# Patient Record
Sex: Female | Born: 1940 | Race: White | Hispanic: No | Marital: Married | Smoking: Never smoker
Health system: Southern US, Community
[De-identification: ages and names within clinical notes are randomized; demographics above are authoritative.]

## PROBLEM LIST (undated history)

## (undated) DIAGNOSIS — J189 Pneumonia, unspecified organism: Secondary | ICD-10-CM

## (undated) DIAGNOSIS — I447 Left bundle-branch block, unspecified: Secondary | ICD-10-CM

## (undated) DIAGNOSIS — D689 Coagulation defect, unspecified: Secondary | ICD-10-CM

## (undated) DIAGNOSIS — I10 Essential (primary) hypertension: Secondary | ICD-10-CM

## (undated) HISTORY — DX: Coagulation defect, unspecified: D68.9

## (undated) HISTORY — PX: ABDOMINAL HYSTERECTOMY: SHX81

## (undated) HISTORY — PX: EYE SURGERY: SHX253

## (undated) HISTORY — DX: Pneumonia, unspecified organism: J18.9

## (undated) HISTORY — PX: MEDIAL PARTIAL KNEE REPLACEMENT: SHX5965

## (undated) HISTORY — DX: Essential (primary) hypertension: I10

## (undated) HISTORY — PX: OTHER SURGICAL HISTORY: SHX169

---

## 2004-02-21 ENCOUNTER — Ambulatory Visit: Payer: Self-pay | Admitting: Family Medicine

## 2005-02-27 ENCOUNTER — Ambulatory Visit: Payer: Self-pay | Admitting: Family Medicine

## 2005-03-13 ENCOUNTER — Ambulatory Visit: Payer: Self-pay | Admitting: Family Medicine

## 2005-04-11 ENCOUNTER — Ambulatory Visit: Payer: Self-pay | Admitting: Gastroenterology

## 2006-02-28 ENCOUNTER — Ambulatory Visit: Payer: Self-pay | Admitting: Family Medicine

## 2006-04-29 ENCOUNTER — Ambulatory Visit: Payer: Self-pay | Admitting: Gastroenterology

## 2007-03-26 ENCOUNTER — Ambulatory Visit: Payer: Self-pay | Admitting: Family Medicine

## 2008-09-16 ENCOUNTER — Ambulatory Visit: Payer: Self-pay | Admitting: Family Medicine

## 2008-12-22 ENCOUNTER — Ambulatory Visit: Payer: Self-pay | Admitting: Family Medicine

## 2009-05-24 ENCOUNTER — Ambulatory Visit: Payer: Self-pay | Admitting: Gastroenterology

## 2009-05-27 ENCOUNTER — Ambulatory Visit: Payer: Self-pay | Admitting: Family Medicine

## 2009-11-28 ENCOUNTER — Ambulatory Visit: Payer: Self-pay | Admitting: Family Medicine

## 2010-01-03 ENCOUNTER — Ambulatory Visit: Payer: Self-pay | Admitting: Family Medicine

## 2010-01-16 ENCOUNTER — Ambulatory Visit: Payer: Self-pay | Admitting: Family Medicine

## 2010-06-05 ENCOUNTER — Ambulatory Visit: Payer: Self-pay | Admitting: Family Medicine

## 2010-06-18 ENCOUNTER — Emergency Department: Payer: Self-pay | Admitting: Emergency Medicine

## 2011-01-22 ENCOUNTER — Ambulatory Visit: Payer: Self-pay | Admitting: Family Medicine

## 2011-08-09 ENCOUNTER — Ambulatory Visit: Payer: Self-pay | Admitting: Ophthalmology

## 2011-08-09 LAB — POTASSIUM: Potassium: 4.1 mmol/L (ref 3.5–5.1)

## 2011-08-29 ENCOUNTER — Ambulatory Visit: Payer: Self-pay | Admitting: Ophthalmology

## 2011-12-24 DIAGNOSIS — R739 Hyperglycemia, unspecified: Secondary | ICD-10-CM | POA: Insufficient documentation

## 2012-01-23 ENCOUNTER — Ambulatory Visit: Payer: Self-pay | Admitting: Family Medicine

## 2012-06-18 ENCOUNTER — Emergency Department: Payer: Self-pay

## 2012-06-18 LAB — COMPREHENSIVE METABOLIC PANEL
Albumin: 4.1 g/dL (ref 3.4–5.0)
Anion Gap: 4 — ABNORMAL LOW (ref 7–16)
Bilirubin,Total: 0.5 mg/dL (ref 0.2–1.0)
Calcium, Total: 9.2 mg/dL (ref 8.5–10.1)
Chloride: 105 mmol/L (ref 98–107)
Co2: 30 mmol/L (ref 21–32)
Creatinine: 0.76 mg/dL (ref 0.60–1.30)
EGFR (African American): >60
EGFR (Non-African Amer.): >60
Glucose: 106 mg/dL — ABNORMAL HIGH (ref 65–99)
Osmolality: 281 (ref 275–301)
Potassium: 3.8 mmol/L (ref 3.5–5.1)
SGOT(AST): 25 U/L (ref 15–37)
Sodium: 139 mmol/L (ref 136–145)
Total Protein: 7.5 g/dL (ref 6.4–8.2)

## 2012-06-18 LAB — CBC
HCT: 39.7 % (ref 35.0–47.0)
HGB: 13.5 g/dL (ref 12.0–16.0)
MCH: 30 pg (ref 26.0–34.0)
MCHC: 34.1 g/dL (ref 32.0–36.0)
Platelet: 151 10*3/uL (ref 150–440)
RBC: 4.51 10*6/uL (ref 3.80–5.20)
RDW: 14.2 % (ref 11.5–14.5)
WBC: 6.5 10*3/uL (ref 3.6–11.0)

## 2012-06-18 LAB — TROPONIN I: Troponin-I: <0.02 ng/mL

## 2012-06-18 LAB — PROTIME-INR: Prothrombin Time: 11.9 secs (ref 11.5–14.7)

## 2012-07-24 ENCOUNTER — Ambulatory Visit: Payer: Self-pay | Admitting: Unknown Physician Specialty

## 2012-11-12 ENCOUNTER — Ambulatory Visit: Payer: Self-pay | Admitting: Unknown Physician Specialty

## 2013-01-23 ENCOUNTER — Ambulatory Visit: Payer: Self-pay | Admitting: Family Medicine

## 2013-04-08 ENCOUNTER — Ambulatory Visit: Payer: Self-pay | Admitting: Ophthalmology

## 2013-07-15 DIAGNOSIS — IMO0001 Reserved for inherently not codable concepts without codable children: Secondary | ICD-10-CM | POA: Insufficient documentation

## 2013-07-15 DIAGNOSIS — Z7689 Persons encountering health services in other specified circumstances: Secondary | ICD-10-CM | POA: Insufficient documentation

## 2013-10-13 DIAGNOSIS — M179 Osteoarthritis of knee, unspecified: Secondary | ICD-10-CM | POA: Insufficient documentation

## 2013-10-13 DIAGNOSIS — Z9889 Other specified postprocedural states: Secondary | ICD-10-CM | POA: Insufficient documentation

## 2013-10-13 DIAGNOSIS — M1710 Unilateral primary osteoarthritis, unspecified knee: Secondary | ICD-10-CM | POA: Insufficient documentation

## 2013-10-27 ENCOUNTER — Ambulatory Visit: Payer: Self-pay | Admitting: Family Medicine

## 2014-01-26 ENCOUNTER — Ambulatory Visit: Payer: Self-pay | Admitting: Family Medicine

## 2014-03-31 DIAGNOSIS — Z9889 Other specified postprocedural states: Secondary | ICD-10-CM | POA: Insufficient documentation

## 2014-03-31 DIAGNOSIS — Z86718 Personal history of other venous thrombosis and embolism: Secondary | ICD-10-CM | POA: Insufficient documentation

## 2014-03-31 DIAGNOSIS — Z8679 Personal history of other diseases of the circulatory system: Secondary | ICD-10-CM | POA: Insufficient documentation

## 2014-03-31 DIAGNOSIS — I493 Ventricular premature depolarization: Secondary | ICD-10-CM | POA: Insufficient documentation

## 2014-06-27 NOTE — Op Note (Signed)
PATIENT NAME:  Theresa Wise, BLANN MR#:  [id number] DATE OF BIRTH:  05-Jun-1940  DATE OF PROCEDURE:  08/29/2011  PROCEDURES PERFORMED:  1. Pars plana vitrectomy of the left eye.  2. Internal limiting membrane peel of the left eye.   PREOPERATIVE DIAGNOSES:  1. Epiretinal membrane of the left eye.  2. Retinal edema of the left eye.   POSTOPERATIVE DIAGNOSES:  1. Epiretinal membrane of the left eye.  2. Retinal edema of the left eye.   ESTIMATED BLOOD LOSS: Less than 1 mL.   PRIMARY SURGEON: Teresa Pelton. Flynn Lininger, MD  ANESTHESIA: Retrobulbar block of the left eye with monitored anesthesia care.   COMPLICATIONS: None.   INDICATIONS FOR PROCEDURE: This is a patient that presented to my office with slowly decreasing vision in the left eye. Examination revealed an epiretinal membrane with associated retinal edema. Risks, benefits, and alternatives of the above procedure were discussed and the patient wished to proceed.   DETAILS OF PROCEDURE: After informed consent was obtained, the patient was brought into the operative suite at [facility]. Patient was placed in supine position, was given a small dose of Alfenta and a retrobulbar block was performed on the left eye by the primary surgeon without any complications. Left eye was prepped and draped in sterile manner. After lid speculum was inserted, a 25-gauge trocar was placed 4 mm beyond the limbus in the inferotemporal quadrant through displaced conjunctiva in an oblique fashion. The infusion cannula was turned on and inserted through the trocar and secured in position with Steri-Strips. Two more trocars were placed in a similar fashion superotemporally and superonasally. The vitreous cutter and light pipe were introduced in the eye and a core vitrectomy was performed. The vitreous face was confirmed to be elevated and the peripheral vitreous was trimmed for 360 degrees. Indocyanine green was injected onto the posterior pole and  then removed within 30 seconds. An epiretinal membrane peel followed by an internal limiting membrane peel was performed for 360 degrees around the fovea for a total diameter of two disk diameters. Scleral depressed exam was performed for 360 degrees. No signs of any open tears or retinal detachment could be identified for 360 degrees. A 75% air-fluid exchange was performed and the trocars were removed. The wounds were noted to be airtight and pressure in the eye was confirmed to be approximately 15 mmHg. 5 mg of dexamethasone was given into the inferior fornix and the lid speculum was removed. The eye was cleaned and TobraDex was placed in the eye. A patch and shield were placed over the eye and the patient was taken to postanesthesia care with instructions to remain head up.    ____________________________ Teresa Pelton. Starling Manns, MD mfa:cms D: 08/29/2011 10:13:57 ET T: 08/29/2011 12:35:58 ET JOB#: [id number]  cc: Teresa Pelton. Starling Manns, MD, <Dictator>  Coralee Rud MD ELECTRONICALLY SIGNED 09/17/2011 17:41

## 2014-07-20 DIAGNOSIS — C760 Malignant neoplasm of head, face and neck: Secondary | ICD-10-CM | POA: Insufficient documentation

## 2014-07-20 DIAGNOSIS — C44300 Unspecified malignant neoplasm of skin of unspecified part of face: Secondary | ICD-10-CM | POA: Insufficient documentation

## 2014-07-21 ENCOUNTER — Other Ambulatory Visit: Payer: Self-pay | Admitting: Family Medicine

## 2014-07-21 DIAGNOSIS — M810 Age-related osteoporosis without current pathological fracture: Secondary | ICD-10-CM

## 2014-11-20 IMAGING — CT CT OF THE RIGHT KNEE WITHOUT CONTRAST
1 of 3 series · 10 of 14 positions shown, 13 images · non-contrast
Comparison: none

REASON FOR EXAM: BRANCH KNEE    osteoarthrosis
COMMENTS:

PROCEDURE:     KCT - KCT KNEE RIGHT WITHOUT CONTRAST  - November 12, 2012 [DATE]
RESULT:     History: Osteoarthritis. Preoperative knee evaluation.

[Series 3: knee 0.75 b30f · axial · 0.42mm/px · z∈[+452,+656]mm · 10 of 357 slices shown, 13 images]
[im 33/357  soft-tissue]
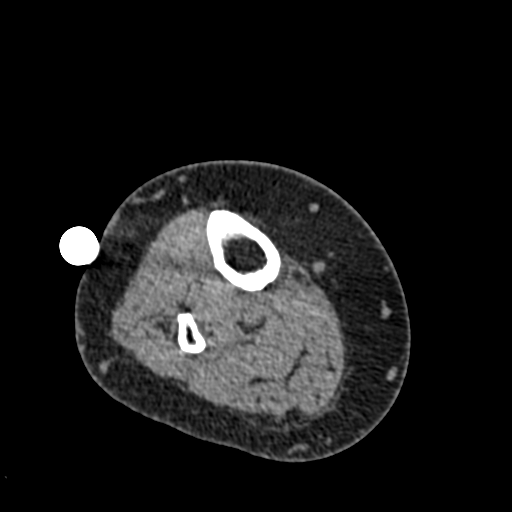
[im 33/357  bone]
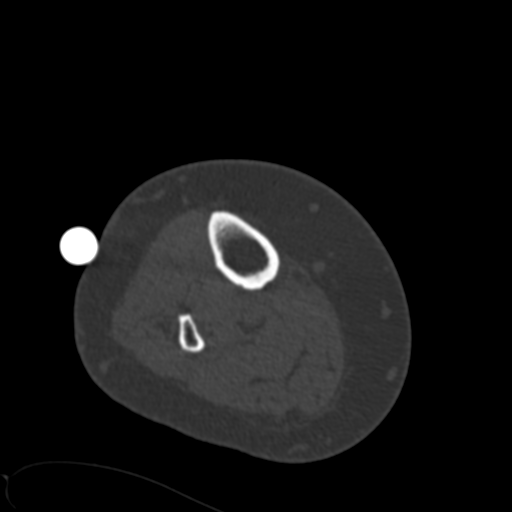
[im 65/357  bone]
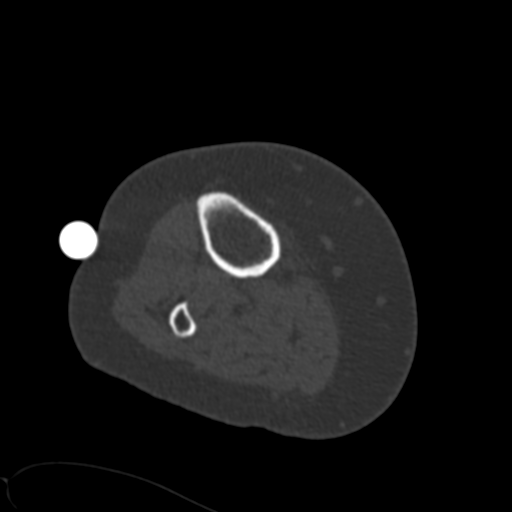
[im 98/357  bone]
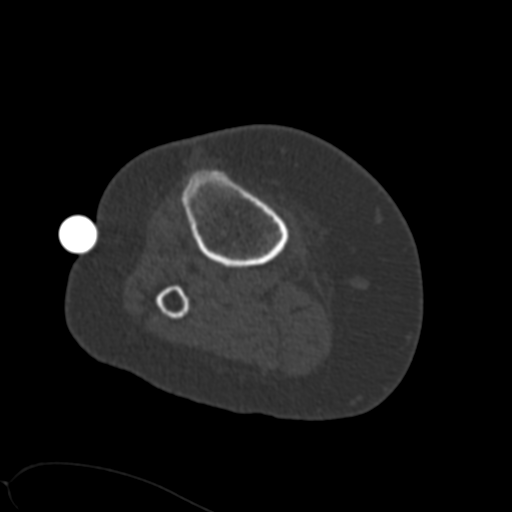
[im 130/357  bone]
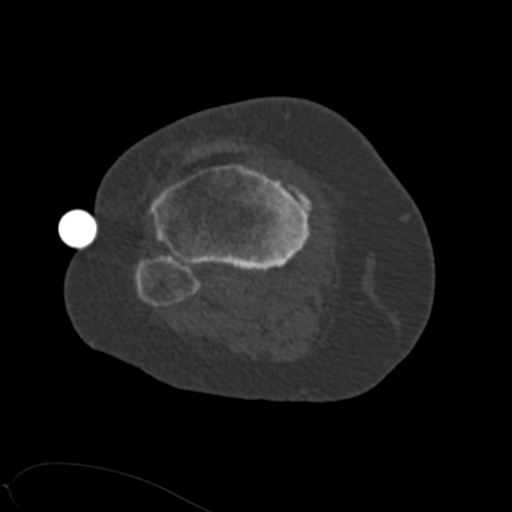
[im 162/357  soft-tissue]
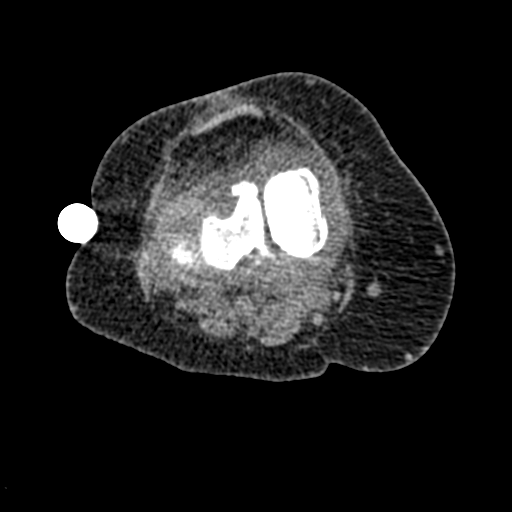
[im 162/357  bone]
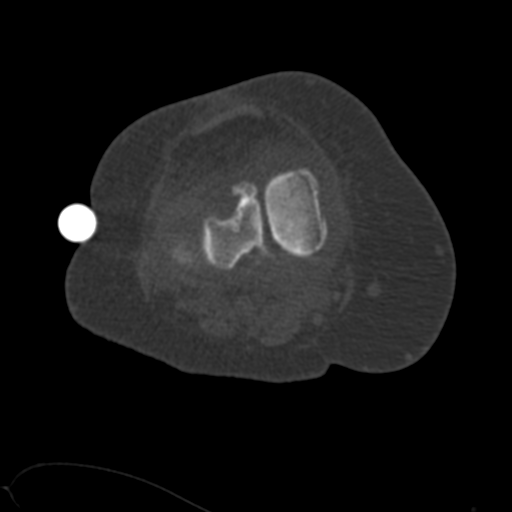
[im 195/357  bone]
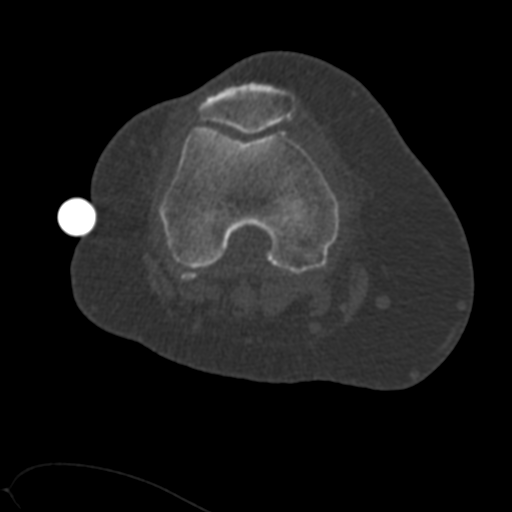
[im 227/357  bone]
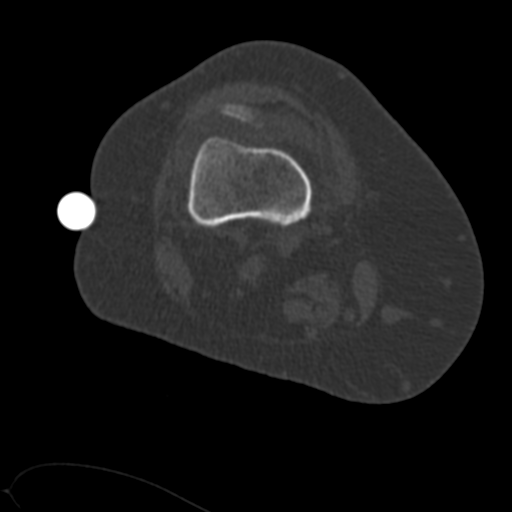
[im 259/357  bone]
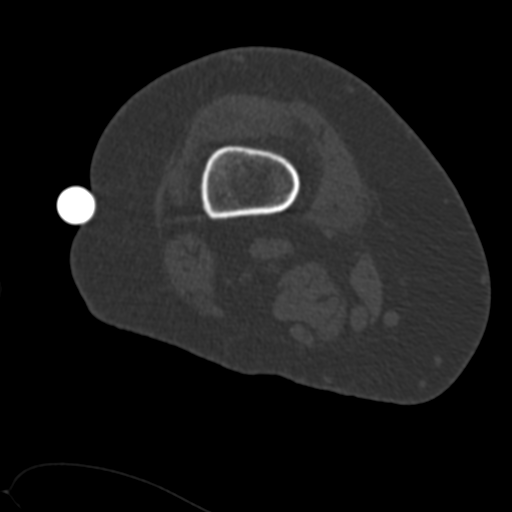
[im 292/357  soft-tissue]
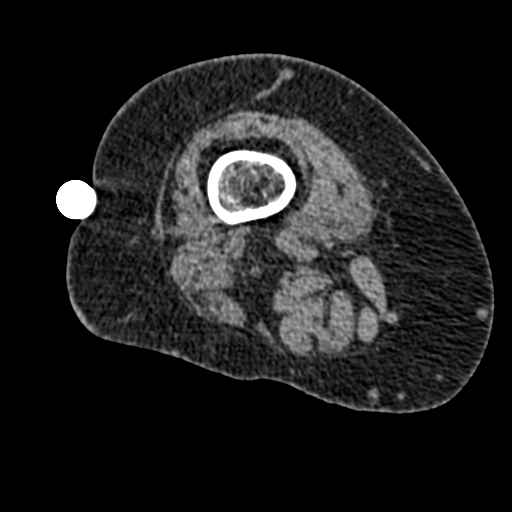
[im 292/357  bone]
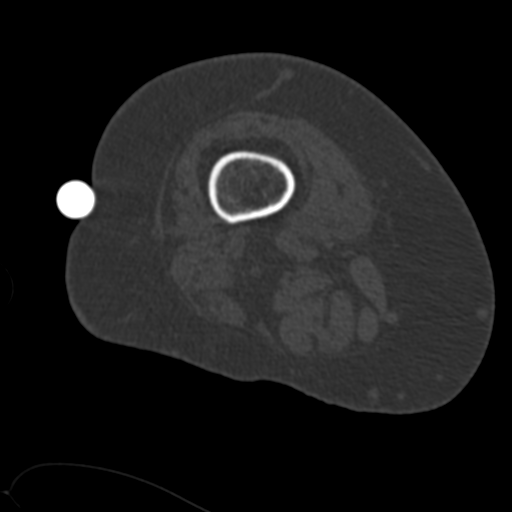
[im 324/357  bone]
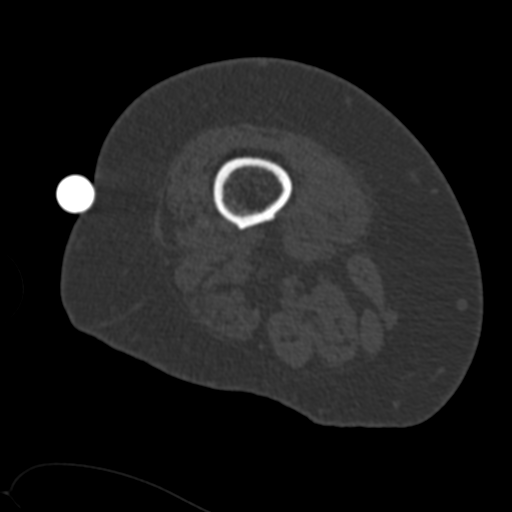

[10 of 14 positions shown; findings below may reference images not displayed]

FINDINGS: Standard CT obtained. Exam  obtained for preoperative evaluation .
There are severe degenerative changes noted about the right knee. Mild
changes about the right hip and ankle. No focal bony lesions identified.
IMPRESSION: Preoperative CT reveals severe degenerative changes right
knee.

## 2015-01-10 ENCOUNTER — Other Ambulatory Visit: Payer: Self-pay | Admitting: Family Medicine

## 2015-01-10 DIAGNOSIS — Z1231 Encounter for screening mammogram for malignant neoplasm of breast: Secondary | ICD-10-CM

## 2015-01-26 DIAGNOSIS — Z85828 Personal history of other malignant neoplasm of skin: Secondary | ICD-10-CM | POA: Insufficient documentation

## 2015-01-31 ENCOUNTER — Other Ambulatory Visit: Payer: Self-pay | Admitting: Family Medicine

## 2015-01-31 ENCOUNTER — Ambulatory Visit
Admission: RE | Admit: 2015-01-31 | Discharge: 2015-01-31 | Disposition: A | Discharge status: Home / Self Care | Payer: Medicare Other | Source: Ambulatory Visit | Attending: Family Medicine | Admitting: Family Medicine

## 2015-01-31 DIAGNOSIS — Z1231 Encounter for screening mammogram for malignant neoplasm of breast: Secondary | ICD-10-CM

## 2015-01-31 DIAGNOSIS — M810 Age-related osteoporosis without current pathological fracture: Secondary | ICD-10-CM | POA: Diagnosis not present

## 2015-01-31 DIAGNOSIS — Z13820 Encounter for screening for osteoporosis: Secondary | ICD-10-CM | POA: Insufficient documentation

## 2015-08-02 DIAGNOSIS — R29898 Other symptoms and signs involving the musculoskeletal system: Secondary | ICD-10-CM | POA: Insufficient documentation

## 2015-08-02 DIAGNOSIS — R269 Unspecified abnormalities of gait and mobility: Secondary | ICD-10-CM | POA: Insufficient documentation

## 2015-08-23 ENCOUNTER — Inpatient Hospital Stay: Payer: Medicare Other | Attending: Oncology | Admitting: Oncology

## 2015-08-23 ENCOUNTER — Inpatient Hospital Stay: Payer: Medicare Other

## 2015-08-23 ENCOUNTER — Encounter: Payer: Self-pay | Admitting: Oncology

## 2015-08-23 VITALS — BP 178/84 | HR 54 | Temp 98.0°F | Wt 162.5 lb

## 2015-08-23 DIAGNOSIS — Z79899 Other long term (current) drug therapy: Secondary | ICD-10-CM | POA: Diagnosis not present

## 2015-08-23 DIAGNOSIS — E785 Hyperlipidemia, unspecified: Secondary | ICD-10-CM | POA: Insufficient documentation

## 2015-08-23 DIAGNOSIS — I10 Essential (primary) hypertension: Secondary | ICD-10-CM | POA: Insufficient documentation

## 2015-08-23 DIAGNOSIS — D696 Thrombocytopenia, unspecified: Secondary | ICD-10-CM | POA: Diagnosis not present

## 2015-08-23 DIAGNOSIS — M810 Age-related osteoporosis without current pathological fracture: Secondary | ICD-10-CM | POA: Insufficient documentation

## 2015-08-23 DIAGNOSIS — M1990 Unspecified osteoarthritis, unspecified site: Secondary | ICD-10-CM | POA: Insufficient documentation

## 2015-08-23 DIAGNOSIS — R609 Edema, unspecified: Secondary | ICD-10-CM | POA: Diagnosis not present

## 2015-08-23 LAB — FERRITIN: FERRITIN: 50 ng/mL (ref 11–307)

## 2015-08-23 LAB — CBC
HCT: 38.1 % (ref 35.0–47.0)
HEMOGLOBIN: 13.2 g/dL (ref 12.0–16.0)
MCH: 30.3 pg (ref 26.0–34.0)
MCHC: 34.5 g/dL (ref 32.0–36.0)
MCV: 87.9 fL (ref 80.0–100.0)
Platelets: 143 10*3/uL — ABNORMAL LOW (ref 150–440)
RBC: 4.34 MIL/uL (ref 3.80–5.20)
RDW: 13.5 % (ref 11.5–14.5)
WBC: 4.2 10*3/uL (ref 3.6–11.0)

## 2015-08-23 LAB — VITAMIN B12: Vitamin B-12: 211 pg/mL (ref 180–914)

## 2015-08-23 LAB — IRON AND TIBC
IRON: 69 ug/dL (ref 28–170)
SATURATION RATIOS: 18 % (ref 10.4–31.8)
TIBC: 376 ug/dL (ref 250–450)
UIBC: 307 ug/dL

## 2015-08-23 LAB — FOLATE: Folate: 26 ng/mL (ref >5.9)

## 2015-08-23 NOTE — Progress Notes (Signed)
Patient has history of high blood pressure and lower extremity edema.  Patient advised to inform primary care physician.

## 2015-08-24 LAB — PROTEIN ELECTROPHORESIS, SERUM
A/G Ratio: 1.4 (ref 0.7–1.7)
ALPHA-1-GLOBULIN: 0.3 g/dL (ref 0.0–0.4)
ALPHA-2-GLOBULIN: 0.6 g/dL (ref 0.4–1.0)
Albumin ELP: 4 g/dL (ref 2.9–4.4)
Beta Globulin: 1 g/dL (ref 0.7–1.3)
GLOBULIN, TOTAL: 2.8 g/dL (ref 2.2–3.9)
Gamma Globulin: 0.9 g/dL (ref 0.4–1.8)
TOTAL PROTEIN ELP: 6.8 g/dL (ref 6.0–8.5)

## 2015-08-24 LAB — PLATELET ANTIBODY PROFILE, SERUM
HLA Ab Ser Ql EIA: NEGATIVE
IA/IIA ANTIBODY: NEGATIVE
IB/IX ANTIBODY: NEGATIVE
IIB/IIIA Antibody: NEGATIVE

## 2015-08-24 NOTE — Progress Notes (Signed)
[facility]  [phone] [phone]  ID: Arlenne J Bueso OB: 01/16/1941  MR#: [id number]  CSN#:[id number]  No care team member to display  CHIEF COMPLAINT:  Chief Complaint  Patient presents with  . New Patient (Initial Visit)    INTERVAL HISTORY: Patient is 75-year-old female who was noted to have a declining platelet count on routine blood work. Her only complaint today is of worsening lower extremity edema. She otherwise feels well and is asymptomatic. She denies any easy bleeding or bruising. She has no neurologic complaints. She denies any recent fevers or illnesses. She has a good appetite and denies weight loss. She has no chest pain or shortness of breath. She denies any nausea, vomiting, constipation, or diarrhea. She has no urinary complaints. Patient feels at her baseline and offers no further specific complaints today.  REVIEW OF SYSTEMS:   Review of Systems  Constitutional: Negative.  Negative for fever, weight loss and malaise/fatigue.  Respiratory: Negative.  Negative for cough and shortness of breath.   Cardiovascular: Positive for leg swelling. Negative for chest pain.  Gastrointestinal: Negative.  Negative for abdominal pain.  Genitourinary: Negative.   Musculoskeletal: Negative.   Neurological: Negative.  Negative for weakness.  Psychiatric/Behavioral: Negative.     As per HPI. Otherwise, a complete review of systems is negatve.  PAST MEDICAL HISTORY: Past Medical History  Diagnosis Date  . Clotting disorder (HCC)   . Hypertension     PAST SURGICAL HISTORY: History reviewed. No pertinent past surgical history.  FAMILY HISTORY Family History  Problem Relation Age of Onset  . Diabetes Father        ADVANCED DIRECTIVES:    HEALTH MAINTENANCE: Social History  Substance Use Topics  . Smoking status: Never Smoker   . Smokeless tobacco: None  . Alcohol Use: No     Colonoscopy:  PAP:  Bone density:  Lipid  panel:  Allergies  Allergen Reactions  . Codeine Hives    Current Outpatient Prescriptions  Medication Sig Dispense Refill  . amLODipine (NORVASC) 5 MG tablet TAKE 1 TABLET (5 MG TOTAL) BY MOUTH ONCE DAILY.  6  . Ascorbic Acid (VITAMIN C) 1000 MG tablet Take by mouth.    . Cholecalciferol (VITAMIN D3) 2000 units capsule Take by mouth.    . hydrochlorothiazide (HYDRODIURIL) 25 MG tablet     . metoprolol (LOPRESSOR) 100 MG tablet     . ramipril (ALTACE) 10 MG capsule Take by mouth.    . simvastatin (ZOCOR) 10 MG tablet      No current facility-administered medications for this visit.    OBJECTIVE: Filed Vitals:   08/23/15 1102  BP: 178/84  Pulse: 54  Temp: 98 F (36.7 C)     There is no height on file to calculate BMI.    ECOG FS:0 - Asymptomatic  General: Well-developed, well-nourished, no acute distress. Eyes: Pink conjunctiva, anicteric sclera. HEENT: Normocephalic, moist mucous membranes, clear oropharnyx. Lungs: Clear to auscultation bilaterally. Heart: Regular rate and rhythm. No rubs, murmurs, or gallops. Abdomen: Soft, nontender, nondistended. No organomegaly noted, normoactive bowel sounds. Musculoskeletal: Mild bilateral lower extremity edema. Neuro: Alert, answering all questions appropriately. Cranial nerves grossly intact. Skin: No rashes or petechiae noted. Psych: Normal affect. Lymphatics: No cervical, calvicular, axillary or inguinal LAD.   LAB RESULTS:  Lab Results  Component Value Date   NA 139 06/18/2012   K 3.8 06/18/2012   CL 105 06/18/2012   CO2 30 06/18/2012   GLUCOSE 106* 06/18/2012     BUN 22* 06/18/2012   CREATININE 0.76 06/18/2012   CALCIUM 9.2 06/18/2012   PROT 7.5 06/18/2012   ALBUMIN 4.1 06/18/2012   AST 25 06/18/2012   ALT 23 06/18/2012   ALKPHOS 67 06/18/2012   BILITOT 0.5 06/18/2012   GFRNONAA >60 06/18/2012   GFRAA >60 06/18/2012    Lab Results  Component Value Date   WBC 4.2 08/23/2015   HGB 13.2 08/23/2015   HCT 38.1  08/23/2015   MCV 87.9 08/23/2015   PLT 143* 08/23/2015   Lab Results  Component Value Date   IRON 69 08/23/2015   TIBC 376 08/23/2015   IRONPCTSAT 18 08/23/2015    Lab Results  Component Value Date   FERRITIN 50 08/23/2015     STUDIES: No results found.  ASSESSMENT: Thrombocytopenia.  PLAN:    1. Thrombocytopenia: Patient has slowly declining platelet count over 1 year with her platelets decreasing from 185-128. White count today has increased to 143 and is nearly back to normal limits. All of her lab work from today is either negative or within normal limits. Platelet antibodies and protein electrophoresis are pending at time of dictation. Unclear etiology of thrombocytopenia at this point. Patient does not require bone marrow biopsy. No further intervention is needed. Return to clinic in 3 months with repeat laboratory work and further evaluation. 2. Hypertension: Patient's blood pressure is significantly elevated today. Results were forwarded to primary care. 3. Peripheral edema: Continue hydrochlorothiazide and further treatment with primary care physician.  Patient expressed understanding and was in agreement with this plan. She also understands that She can call clinic at any time with any questions, concerns, or complaints.   Timothy J Finnegan, MD   08/24/2015 10:10 AM      

## 2015-10-31 ENCOUNTER — Other Ambulatory Visit: Payer: Self-pay | Admitting: Neurology

## 2015-10-31 DIAGNOSIS — R519 Headache, unspecified: Secondary | ICD-10-CM

## 2015-10-31 DIAGNOSIS — R51 Headache: Principal | ICD-10-CM

## 2015-11-12 ENCOUNTER — Ambulatory Visit
Admission: RE | Admit: 2015-11-12 | Discharge: 2015-11-12 | Disposition: A | Discharge status: Home / Self Care | Payer: Medicare Other | Source: Ambulatory Visit | Attending: Neurology | Admitting: Neurology

## 2015-11-12 DIAGNOSIS — Z96651 Presence of right artificial knee joint: Secondary | ICD-10-CM | POA: Insufficient documentation

## 2015-11-12 DIAGNOSIS — M47892 Other spondylosis, cervical region: Secondary | ICD-10-CM | POA: Diagnosis not present

## 2015-11-12 DIAGNOSIS — R51 Headache: Secondary | ICD-10-CM | POA: Insufficient documentation

## 2015-11-12 DIAGNOSIS — I10 Essential (primary) hypertension: Secondary | ICD-10-CM | POA: Insufficient documentation

## 2015-11-12 DIAGNOSIS — R9082 White matter disease, unspecified: Secondary | ICD-10-CM | POA: Insufficient documentation

## 2015-11-12 DIAGNOSIS — R519 Headache, unspecified: Secondary | ICD-10-CM

## 2015-11-12 LAB — POCT I-STAT CREATININE: Creatinine, Ser: 0.9 mg/dL (ref 0.44–1.00)

## 2015-11-12 MED ORDER — GADOBENATE DIMEGLUMINE 529 MG/ML IV SOLN
15.0000 mL | Freq: Once | INTRAVENOUS | Status: AC | PRN
  Administered 2015-11-12: 15 mL via INTRAVENOUS

## 2015-11-21 DIAGNOSIS — D696 Thrombocytopenia, unspecified: Secondary | ICD-10-CM | POA: Insufficient documentation

## 2015-11-21 NOTE — Progress Notes (Signed)
[place]  [phone] [phone]  ID: Theresa Wise OB: March 27, 1940  MR#: [id number]  QQI#:[id number]  Patient Care Team: Gayland Curry, MD as PCP - General (Family Medicine)  CHIEF COMPLAINT: Thrombocytopenia  INTERVAL HISTORY: [name] for repeat laboratory work and further evaluation. She is having some lower extremity neurologic complaints and is currently being worked up by Dr. Brigitte Pulse. She otherwise feels well and is asymptomatic. She denies any easy bleeding or bruising. She has no other neurologic complaints. She denies any recent fevers or illnesses. She has a good appetite and denies weight loss. She has no chest pain or shortness of breath. She denies any nausea, vomiting, constipation, or diarrhea. She has no urinary complaints. Patient feels at her baseline and offers no further specific complaints today.  REVIEW OF SYSTEMS:   Review of Systems  Constitutional: Negative.  Negative for fever, malaise/fatigue and weight loss.  Respiratory: Negative.  Negative for cough and shortness of breath.   Cardiovascular: Positive for leg swelling. Negative for chest pain.  Gastrointestinal: Negative.  Negative for abdominal pain.  Genitourinary: Negative.   Musculoskeletal: Negative.   Neurological: Negative.  Negative for weakness.  Psychiatric/Behavioral: Negative.     As per HPI. Otherwise, a complete review of systems is negative.  PAST MEDICAL HISTORY: Past Medical History:  Diagnosis Date  . Clotting disorder ([place])   . Hypertension     PAST SURGICAL HISTORY: No past surgical history on file.  FAMILY HISTORY Family History  Problem Relation Age of Onset  . Diabetes Father        ADVANCED DIRECTIVES:    HEALTH MAINTENANCE: Social History  Substance Use Topics  . Smoking status: Never Smoker  . Smokeless tobacco: Not on file  . Alcohol use No     Colonoscopy:  PAP:  Bone density:  Lipid  panel:  Allergies  Allergen Reactions  . Codeine Hives    Current Outpatient Prescriptions  Medication Sig Dispense Refill  . amLODipine (NORVASC) 5 MG tablet TAKE 1 TABLET (5 MG TOTAL) BY MOUTH ONCE DAILY.  6  . Ascorbic Acid (VITAMIN C) 1000 MG tablet Take 1,000 mg by mouth daily.     . Cholecalciferol (VITAMIN D3) 2000 units capsule Take 2,000 Units by mouth daily.     . metoprolol (LOPRESSOR) 100 MG tablet Take 100 mg by mouth 2 (two) times daily.     . ramipril (ALTACE) 10 MG capsule Take 20 mg by mouth 2 (two) times daily.     . simvastatin (ZOCOR) 10 MG tablet Take 10 mg by mouth daily at 6 PM.      No current facility-administered medications for this visit.     OBJECTIVE: Vitals:   11/22/15 1050  BP: (!) 194/104  Pulse: (!) 57  Resp: 18  Temp: 97.3 F (36.3 C)     There is no height or weight on file to calculate BMI.    ECOG FS:0 - Asymptomatic  General: Well-developed, well-nourished, no acute distress. Eyes: Pink conjunctiva, anicteric sclera. Lungs: Clear to auscultation bilaterally. Heart: Regular rate and rhythm. No rubs, murmurs, or gallops. Abdomen: Soft, nontender, nondistended. No organomegaly noted, normoactive bowel sounds. Musculoskeletal: Mild bilateral lower extremity edema. Neuro: Alert, answering all questions appropriately. Cranial nerves grossly intact. Skin: No rashes or petechiae noted. Psych: Normal affect.  LAB RESULTS:  Lab Results  Component Value Date   NA 139 06/18/2012   K 3.8 06/18/2012   CL 105 06/18/2012  CO2 30 06/18/2012   GLUCOSE 106 (H) 06/18/2012   BUN 22 (H) 06/18/2012   CREATININE 0.90 11/12/2015   CALCIUM 9.2 06/18/2012   PROT 7.5 06/18/2012   ALBUMIN 4.1 06/18/2012   AST 25 06/18/2012   ALT 23 06/18/2012   ALKPHOS 67 06/18/2012   BILITOT 0.5 06/18/2012   GFRNONAA >60 06/18/2012   GFRAA >60 06/18/2012    Lab Results  Component Value Date   WBC 4.2 08/23/2015   HGB 13.2 08/23/2015   HCT 38.1 08/23/2015    MCV 87.9 08/23/2015   PLT 143 (L) 08/23/2015   Lab Results  Component Value Date   IRON 69 08/23/2015   TIBC 376 08/23/2015   IRONPCTSAT 18 08/23/2015    Lab Results  Component Value Date   FERRITIN 50 08/23/2015     STUDIES: Mr Jeri Cos HB Contrast  Result Date: 11/12/2015 CLINICAL DATA:  Balance issues. Headaches for 1 year. Dizziness. Trauma to head 1 year ago. EXAM: MRI HEAD WITHOUT AND WITH CONTRAST TECHNIQUE: Multiplanar, multiecho pulse sequences of the brain and surrounding structures were obtained without and with intravenous contrast. CONTRAST:  76m MULTIHANCE GADOBENATE DIMEGLUMINE 529 MG/ML IV SOLN COMPARISON:  CT face 06/18/2010. Report of brain MRI 12/26/1998. The images are no longer available. FINDINGS: Mild atrophy and moderate white matter changes are advanced for age. No acute infarct, hemorrhage, or mass lesion is present. The ventricles are proportionate to the degree of atrophy. Remote lacunar infarcts and predominantly dilated perivascular spaces are present within the basal ganglia bilaterally. Mild white matter changes extend into the brainstem. The cerebellum is unremarkable. The internal auditory canals are within normal limits bilaterally. Flow is present in the major intracranial arteries. A left lens replacement is noted. Globes and orbits are otherwise intact. Moderate diffuse mucosal thickening is present in the left maxillary sinus. There is more minimal disease on the right. Mild mucosal thickening is present in the anterior ethmoid air cells, left greater than right. There is some fluid in the inferior mastoid air cells on the left. No obstructing nasopharyngeal lesion is present. Skullbase is within normal limits. Intracranial midline sagittal structures are unremarkable. A relatively empty sella is noted. Degenerative change are present in the upper cervical spine with slight degenerative anterolisthesis at C3-4 and C4-5. Spinal canal is preserved. The  postcontrast images demonstrate no pathologic enhancement. IMPRESSION: 1. No acute intracranial abnormality. 2. 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 3. No pathologic enhancement. 4. Degenerative changes in the upper cervical spine. Electronically Signed   By: CSan MorelleM.D.   On: 11/12/2015 11:47    ASSESSMENT: Thrombocytopenia.  PLAN:    1. Thrombocytopenia: Patient's platelet count is 134 today, which is essentially unchanged. Previously, all of her laboratory work was either negative or within normal limits including platelet antibodies and protein electrophoresis. No intervention is needed at this time. Patient does not require bone marrow biopsy. After lengthy discussion with the patient, she agreed that no further follow-up is necessary.  Please monitor platelet count 2-3 times a year and if it trends below 100 and remains there, please refer her back for further evaluation.  2. Hypertension: Patient's blood pressure is significantly elevated today. Results were forwarded to primary care. 3. Lower extremities numbness tingling: Continued evaluation and treatment per neurology.   Patient expressed understanding and was in agreement with this plan. She also understands that She can call clinic at any time with any questions, concerns, or complaints.  Lloyd Huger, MD   11/22/2015 11:38 AM

## 2015-11-22 ENCOUNTER — Encounter: Payer: Self-pay | Admitting: Oncology

## 2015-11-22 ENCOUNTER — Inpatient Hospital Stay: Payer: Medicare Other

## 2015-11-22 ENCOUNTER — Inpatient Hospital Stay: Payer: Medicare Other | Attending: Oncology | Admitting: Oncology

## 2015-11-22 ENCOUNTER — Other Ambulatory Visit: Payer: Self-pay

## 2015-11-22 DIAGNOSIS — R200 Anesthesia of skin: Secondary | ICD-10-CM

## 2015-11-22 DIAGNOSIS — I10 Essential (primary) hypertension: Secondary | ICD-10-CM | POA: Insufficient documentation

## 2015-11-22 DIAGNOSIS — D696 Thrombocytopenia, unspecified: Secondary | ICD-10-CM | POA: Diagnosis not present

## 2015-11-22 DIAGNOSIS — D689 Coagulation defect, unspecified: Secondary | ICD-10-CM | POA: Insufficient documentation

## 2015-11-22 DIAGNOSIS — Z79899 Other long term (current) drug therapy: Secondary | ICD-10-CM | POA: Insufficient documentation

## 2015-11-22 DIAGNOSIS — M7989 Other specified soft tissue disorders: Secondary | ICD-10-CM | POA: Insufficient documentation

## 2015-11-22 LAB — CBC WITH DIFFERENTIAL/PLATELET
BASOS ABS: 0.1 10*3/uL (ref 0–0.1)
BASOS PCT: 1 %
EOS ABS: 0.2 10*3/uL (ref 0–0.7)
Eosinophils Relative: 4 %
HCT: 38.2 % (ref 35.0–47.0)
Hemoglobin: 13.1 g/dL (ref 12.0–16.0)
Lymphocytes Relative: 31 %
Lymphs Abs: 1.5 10*3/uL (ref 1.0–3.6)
MCH: 29.9 pg (ref 26.0–34.0)
MCHC: 34.3 g/dL (ref 32.0–36.0)
MCV: 87.3 fL (ref 80.0–100.0)
MONO ABS: 0.4 10*3/uL (ref 0.2–0.9)
MONOS PCT: 9 %
Neutro Abs: 2.7 10*3/uL (ref 1.4–6.5)
Neutrophils Relative %: 55 %
PLATELETS: 134 10*3/uL — AB (ref 150–440)
RBC: 4.38 MIL/uL (ref 3.80–5.20)
RDW: 13.3 % (ref 11.5–14.5)
WBC: 4.9 10*3/uL (ref 3.6–11.0)

## 2015-11-22 NOTE — Progress Notes (Signed)
States is having intermittent weakness in lower extremities that has been occurring for the past few months.

## 2016-01-09 ENCOUNTER — Other Ambulatory Visit: Payer: Self-pay | Admitting: Family Medicine

## 2016-01-09 DIAGNOSIS — Z1231 Encounter for screening mammogram for malignant neoplasm of breast: Secondary | ICD-10-CM

## 2016-02-21 ENCOUNTER — Ambulatory Visit
Admission: RE | Admit: 2016-02-21 | Discharge: 2016-02-21 | Disposition: A | Discharge status: Home / Self Care | Payer: Medicare Other | Source: Ambulatory Visit | Attending: Family Medicine | Admitting: Family Medicine

## 2016-02-21 DIAGNOSIS — Z1231 Encounter for screening mammogram for malignant neoplasm of breast: Secondary | ICD-10-CM | POA: Diagnosis not present

## 2016-05-30 DIAGNOSIS — E538 Deficiency of other specified B group vitamins: Secondary | ICD-10-CM | POA: Insufficient documentation

## 2016-05-30 DIAGNOSIS — G5712 Meralgia paresthetica, left lower limb: Secondary | ICD-10-CM | POA: Insufficient documentation

## 2016-05-30 DIAGNOSIS — R519 Headache, unspecified: Secondary | ICD-10-CM | POA: Insufficient documentation

## 2016-05-30 DIAGNOSIS — G608 Other hereditary and idiopathic neuropathies: Secondary | ICD-10-CM | POA: Insufficient documentation

## 2016-07-10 ENCOUNTER — Encounter: Payer: Self-pay | Admitting: *Deleted

## 2016-07-10 ENCOUNTER — Emergency Department: Payer: Medicare Other

## 2016-07-10 ENCOUNTER — Emergency Department
Admission: EM | Admit: 2016-07-10 | Discharge: 2016-07-10 | Disposition: A | Discharge status: Home / Self Care | Payer: Medicare Other | Attending: Emergency Medicine | Admitting: Emergency Medicine

## 2016-07-10 DIAGNOSIS — R05 Cough: Secondary | ICD-10-CM | POA: Insufficient documentation

## 2016-07-10 DIAGNOSIS — I10 Essential (primary) hypertension: Secondary | ICD-10-CM | POA: Insufficient documentation

## 2016-07-10 DIAGNOSIS — R42 Dizziness and giddiness: Secondary | ICD-10-CM

## 2016-07-10 DIAGNOSIS — R079 Chest pain, unspecified: Secondary | ICD-10-CM | POA: Diagnosis not present

## 2016-07-10 DIAGNOSIS — Z79899 Other long term (current) drug therapy: Secondary | ICD-10-CM | POA: Diagnosis not present

## 2016-07-10 DIAGNOSIS — R0602 Shortness of breath: Secondary | ICD-10-CM | POA: Insufficient documentation

## 2016-07-10 DIAGNOSIS — J449 Chronic obstructive pulmonary disease, unspecified: Secondary | ICD-10-CM | POA: Diagnosis not present

## 2016-07-10 DIAGNOSIS — R110 Nausea: Secondary | ICD-10-CM | POA: Insufficient documentation

## 2016-07-10 LAB — BASIC METABOLIC PANEL
Anion gap: 9 (ref 5–15)
BUN: 18 mg/dL (ref 6–20)
CALCIUM: 8.8 mg/dL — AB (ref 8.9–10.3)
CHLORIDE: 100 mmol/L — AB (ref 101–111)
CO2: 28 mmol/L (ref 22–32)
CREATININE: 0.93 mg/dL (ref 0.44–1.00)
GFR calc non Af Amer: 57 mL/min — ABNORMAL LOW (ref >60)
GLUCOSE: 154 mg/dL — AB (ref 65–99)
Potassium: 3.9 mmol/L (ref 3.5–5.1)
Sodium: 137 mmol/L (ref 135–145)

## 2016-07-10 LAB — CBC
HCT: 38.3 % (ref 35.0–47.0)
Hemoglobin: 13.1 g/dL (ref 12.0–16.0)
MCH: 29.9 pg (ref 26.0–34.0)
MCHC: 34.2 g/dL (ref 32.0–36.0)
MCV: 87.5 fL (ref 80.0–100.0)
PLATELETS: 142 10*3/uL — AB (ref 150–440)
RBC: 4.38 MIL/uL (ref 3.80–5.20)
RDW: 13.9 % (ref 11.5–14.5)
WBC: 12.7 10*3/uL — ABNORMAL HIGH (ref 3.6–11.0)

## 2016-07-10 LAB — TROPONIN I

## 2016-07-10 MED ORDER — MECLIZINE HCL 25 MG PO TABS
25.0000 mg | ORAL_TABLET | Freq: Once | ORAL | Status: AC
  Administered 2016-07-10: 25 mg via ORAL
  Filled 2016-07-10: qty 1

## 2016-07-10 MED ORDER — MECLIZINE HCL 25 MG PO TABS: 25.0000 mg | ORAL_TABLET | Freq: Three times a day (TID) | ORAL | 0 refills | Status: DC | PRN

## 2016-07-10 NOTE — ED Notes (Signed)
MRI  [facility]  CALLED  INFORMED  RN  AMY  COYNE

## 2016-07-10 NOTE — ED Notes (Addendum)
Pt reports feeling dizzy today while lying in bed.  Intermittent chest pain and sob. Nausea earlier today.  No vomiting.  Pt also has redness and swelling to right lower leg. Pt states her cat scratched her yesterday.    Pt alert   Speech clear.  Family with pt

## 2016-07-10 NOTE — ED Provider Notes (Signed)
[facility] Emergency Department Provider Note   ____________________________________________   I have reviewed the triage vital signs and the nursing notes.   HISTORY  Chief Complaint Dizziness  History limited by: Not Limited   HPI Theresa Wise is a 76 y.o. female who presents to the emergency department today with primary complaint for dizziness. The patient states that this started today. She feels lightheaded inside the room spinning around her. This has been constant since it started. It occurs when the patient is lying flat. Patient denies similar symptoms in the past. She has had some accompanying nausea. Patient patient has had some shortness of breath and chest pain today. No fevers. No recent illness.   Past Medical History:  Diagnosis Date  . Clotting disorder ([place])   . Hypertension     Patient Active Problem List   Diagnosis Date Noted  . Thrombocytopenia ([place]) 11/21/2015  . Arthritis 08/23/2015  . HLD (hyperlipidemia) 08/23/2015  . BP (high blood pressure) 08/23/2015  . 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 Abnormal gait 08/02/2015  . Leg weakness 08/02/2015  . H/O malignant neoplasm of skin 01/26/2015  . 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 History of cardiac catheterization 03/31/2014  . Personal history of other diseases of the circulatory system 03/31/2014  . H/O deep venous thrombosis 03/31/2014  . Beat, premature ventricular 03/31/2014  . Arthritis of knee, degenerative 10/13/2013  . History of knee surgery 10/13/2013  . Referred by specialist physician 07/15/2013  . Blood glucose elevated 12/24/2011    History reviewed. No pertinent surgical history.  Prior to Admission medications   Medication Sig Start Date End Date Taking? Authorizing Provider  amLODipine (NORVASC) 5 MG tablet TAKE 1 TABLET (5 MG TOTAL) BY MOUTH ONCE DAILY. 08/14/15   [provider]  Ascorbic Acid (VITAMIN C) 1000 MG tablet  Take 1,000 mg by mouth daily.     [provider]  Cholecalciferol (VITAMIN D3) 2000 units capsule Take 2,000 Units by mouth daily.     [provider]  metoprolol (LOPRESSOR) 100 MG tablet Take 100 mg by mouth 2 (two) times daily.  07/07/15   [provider]  ramipril (ALTACE) 10 MG capsule Take 20 mg by mouth 2 (two) times daily.  05/10/15   [provider]  simvastatin (ZOCOR) 10 MG tablet Take 10 mg by mouth daily at 6 PM.  07/05/15   [provider]    Allergies Codeine  Family History  Problem Relation Age of Onset  . Diabetes Father     Social History Social History  Substance Use Topics  . Smoking status: Never Smoker  . Smokeless tobacco: Never Used  . Alcohol use No    Review of Systems Constitutional: No fever/chills Eyes: No visual changes. ENT: No sore throat. Cardiovascular: Positive chest pain. Respiratory: Positive shortness of breath. Gastrointestinal: No abdominal pain.  No nausea, no vomiting.  No diarrhea.   Genitourinary: Negative for dysuria. Musculoskeletal: Negative for back pain. Skin: Negative for rash. Neurological: Positive for dizziness.  ____________________________________________   PHYSICAL EXAM:  VITAL SIGNS: ED Triage Vitals  Enc Vitals Group     BP 07/10/16 1546 120/69     Pulse Rate 07/10/16 1546 75     Resp 07/10/16 1546 18     Temp 07/10/16 1546 99.9 F (37.7 C)     Temp Source 07/10/16 1546 Oral     SpO2 07/10/16 1546 95 %     Weight 07/10/16 1546 160 lb (  72.6 kg)     Height 07/10/16 1546 4\' 11"  (1.499 m)     Head Circumference --      Peak Flow --      Pain Score 07/10/16 1545 5     Pain Loc --      Pain Edu? --      Excl. in [place]? --     Constitutional: Alert and oriented. Well appearing and in no distress. Eyes: Conjunctivae are normal. Normal extraocular movements. ENT   Head: Normocephalic and atraumatic.   Nose: No congestion/rhinnorhea.   Mouth/Throat: Mucous  membranes are moist.   Neck: No stridor. Hematological/Lymphatic/Immunilogical: No cervical lymphadenopathy. Cardiovascular: Normal rate, regular rhythm.  No murmurs, rubs, or gallops. Respiratory: Normal respiratory effort without tachypnea nor retractions. Breath sounds are clear and equal bilaterally. No wheezes/rales/rhonchi. Gastrointestinal: Soft and non tender. No rebound. No guarding.  Genitourinary: Deferred Musculoskeletal: Normal range of motion in all extremities. No lower extremity edema. Neurologic:  Normal speech and language. No gross focal neurologic deficits are appreciated.  Skin:  Skin is warm, dry and intact. No rash noted. Psychiatric: Mood and affect are normal. Speech and behavior are normal. Patient exhibits appropriate insight and judgment.  ____________________________________________    LABS (pertinent positives/negatives)  Labs Reviewed  BASIC METABOLIC PANEL - Abnormal; Notable for the following:       Result Value   Chloride 100 (*)    Glucose, Bld 154 (*)    Calcium 8.8 (*)    GFR calc non Af Amer 57 (*)    All other components within normal limits  CBC - Abnormal; Notable for the following:    WBC 12.7 (*)    Platelets 142 (*)    All other components within normal limits  TROPONIN I     ____________________________________________   EKG  I, Nance Pear, attending physician, personally viewed and interpreted this EKG  EKG Time: 1548 Rate: 73 Rhythm: normal sinus rhythm Axis: left axis deviation Intervals: qtc 464 QRS: LBBB ST changes: no st elevation Impression: abnormal ekg  ____________________________________________    RADIOLOGY  CXR  IMPRESSION: Borderline cardiomegaly. No focal infiltrate or edema.  MR brain IMPRESSION:  No cause of the acute presentation is identified. Moderate chronic  small-vessel ischemic change of the cerebral hemispheric white  matter as seen previously. This is slightly better seen  because of  less motion degradation.       ____________________________________________   PROCEDURES  Procedures  ____________________________________________   INITIAL IMPRESSION / ASSESSMENT AND PLAN / ED COURSE  Pertinent labs & imaging results that were available during my care of the patient were reviewed by me and considered in my medical decision making (see chart for details).  She presented to the emergency department today because of concerns for dizziness. Patient states it has been constant throughout the day. Did obtain an MRI to evaluate for posterior circulation. This does not show any acute stroke. Other blood work with this mild elevation of white blood cell count. Will trial patient on meclizine. Advise follow-up with primary care.  ____________________________________________   FINAL CLINICAL IMPRESSION(S) / ED DIAGNOSES  Final diagnoses:  Dizziness     Note: This dictation was prepared with Dragon dictation. Any transcriptional errors that result from this process are unintentional     Nance Pear, MD 07/11/16 1511

## 2016-07-10 NOTE — ED Notes (Signed)
Pt taken her belongings, denies missing anything when leaving the ED

## 2016-07-10 NOTE — ED Notes (Signed)
ED Provider at bedside. 

## 2016-07-10 NOTE — ED Notes (Signed)
Report off to [employer]

## 2016-07-10 NOTE — ED Notes (Signed)
Waiting for mri 

## 2016-07-10 NOTE — ED Triage Notes (Signed)
Pt reports having SOB and chest pain beginning today with a dry cough. Pt reports having been dizzy throughout the day as well. No fevers reported at home. Pt able to speak in complete sentences. NAD at this time.   Hx of COPD. No oxygen use at home.

## 2016-07-10 NOTE — ED Notes (Signed)
Patient transported to MRI 

## 2016-07-10 NOTE — Discharge Instructions (Signed)
Please seek medical attention for any high fevers, chest pain, shortness of breath, change in behavior, persistent vomiting, bloody stool or any other new or concerning symptoms.  

## 2016-08-02 ENCOUNTER — Other Ambulatory Visit: Payer: Self-pay | Admitting: Family Medicine

## 2016-08-02 DIAGNOSIS — Z780 Asymptomatic menopausal state: Secondary | ICD-10-CM

## 2016-09-11 DIAGNOSIS — I420 Dilated cardiomyopathy: Secondary | ICD-10-CM | POA: Insufficient documentation

## 2016-10-04 ENCOUNTER — Other Ambulatory Visit: Payer: Self-pay | Admitting: Family Medicine

## 2016-10-04 DIAGNOSIS — Z1231 Encounter for screening mammogram for malignant neoplasm of breast: Secondary | ICD-10-CM

## 2017-02-21 ENCOUNTER — Ambulatory Visit
Admission: RE | Admit: 2017-02-21 | Discharge: 2017-02-21 | Disposition: A | Discharge status: Home / Self Care | Payer: Medicare Other | Source: Ambulatory Visit | Attending: Family Medicine | Admitting: Family Medicine

## 2017-02-21 DIAGNOSIS — Z1231 Encounter for screening mammogram for malignant neoplasm of breast: Secondary | ICD-10-CM

## 2017-02-21 DIAGNOSIS — Z780 Asymptomatic menopausal state: Secondary | ICD-10-CM

## 2017-02-21 DIAGNOSIS — M810 Age-related osteoporosis without current pathological fracture: Secondary | ICD-10-CM | POA: Insufficient documentation

## 2017-04-17 ENCOUNTER — Other Ambulatory Visit: Payer: Self-pay | Admitting: Family Medicine

## 2017-04-17 DIAGNOSIS — N6310 Unspecified lump in the right breast, unspecified quadrant: Secondary | ICD-10-CM

## 2017-04-22 ENCOUNTER — Ambulatory Visit
Admission: RE | Admit: 2017-04-22 | Discharge: 2017-04-22 | Disposition: A | Discharge status: Home / Self Care | Payer: Medicare Other | Source: Ambulatory Visit | Attending: Family Medicine | Admitting: Family Medicine

## 2017-04-22 DIAGNOSIS — N6312 Unspecified lump in the right breast, upper inner quadrant: Secondary | ICD-10-CM | POA: Diagnosis not present

## 2017-04-22 DIAGNOSIS — N6310 Unspecified lump in the right breast, unspecified quadrant: Secondary | ICD-10-CM

## 2017-08-16 ENCOUNTER — Other Ambulatory Visit: Payer: Self-pay

## 2017-08-16 ENCOUNTER — Emergency Department
Admission: EM | Admit: 2017-08-16 | Discharge: 2017-08-16 | Disposition: A | Discharge status: Home / Self Care | Payer: Medicare Other | Attending: Emergency Medicine | Admitting: Emergency Medicine

## 2017-08-16 ENCOUNTER — Encounter: Payer: Self-pay | Admitting: Emergency Medicine

## 2017-08-16 ENCOUNTER — Emergency Department: Payer: Medicare Other

## 2017-08-16 DIAGNOSIS — I10 Essential (primary) hypertension: Secondary | ICD-10-CM | POA: Diagnosis not present

## 2017-08-16 DIAGNOSIS — M47816 Spondylosis without myelopathy or radiculopathy, lumbar region: Secondary | ICD-10-CM | POA: Diagnosis not present

## 2017-08-16 DIAGNOSIS — Z79899 Other long term (current) drug therapy: Secondary | ICD-10-CM | POA: Insufficient documentation

## 2017-08-16 DIAGNOSIS — M545 Low back pain, unspecified: Secondary | ICD-10-CM

## 2017-08-16 DIAGNOSIS — Z96651 Presence of right artificial knee joint: Secondary | ICD-10-CM | POA: Diagnosis not present

## 2017-08-16 DIAGNOSIS — M1990 Unspecified osteoarthritis, unspecified site: Secondary | ICD-10-CM

## 2017-08-16 DIAGNOSIS — Z85828 Personal history of other malignant neoplasm of skin: Secondary | ICD-10-CM | POA: Diagnosis not present

## 2017-08-16 HISTORY — DX: Left bundle-branch block, unspecified: I44.7

## 2017-08-16 LAB — BASIC METABOLIC PANEL
ANION GAP: 11 (ref 5–15)
BUN: 21 mg/dL — ABNORMAL HIGH (ref 6–20)
CHLORIDE: 99 mmol/L — AB (ref 101–111)
CO2: 28 mmol/L (ref 22–32)
Calcium: 9.2 mg/dL (ref 8.9–10.3)
Creatinine, Ser: 1.07 mg/dL — ABNORMAL HIGH (ref 0.44–1.00)
GFR calc non Af Amer: 48 mL/min — ABNORMAL LOW (ref >60)
GFR, EST AFRICAN AMERICAN: 56 mL/min — AB (ref >60)
Glucose, Bld: 90 mg/dL (ref 65–99)
POTASSIUM: 3.8 mmol/L (ref 3.5–5.1)
SODIUM: 138 mmol/L (ref 135–145)

## 2017-08-16 LAB — CBC
HEMATOCRIT: 39.7 % (ref 35.0–47.0)
HEMOGLOBIN: 13.7 g/dL (ref 12.0–16.0)
MCH: 30.2 pg (ref 26.0–34.0)
MCHC: 34.5 g/dL (ref 32.0–36.0)
MCV: 87.6 fL (ref 80.0–100.0)
PLATELETS: 141 10*3/uL — AB (ref 150–440)
RBC: 4.54 MIL/uL (ref 3.80–5.20)
RDW: 13.7 % (ref 11.5–14.5)
WBC: 5.8 10*3/uL (ref 3.6–11.0)

## 2017-08-16 LAB — TROPONIN I: Troponin I: <0.03 ng/mL (ref <0.03)

## 2017-08-16 NOTE — ED Notes (Signed)
Report from alicia, rn.  

## 2017-08-16 NOTE — ED Triage Notes (Signed)
Pt to ED from home c/o bilateral back pain around 1500 today while mowing the grass on her riding lawn mower, denies SOB, diaphoresis, dizziness.  States pain is worse with movement and bending.  Pain was sudden and is grabbing.

## 2017-08-16 NOTE — ED Provider Notes (Signed)
[facility] Emergency Department Provider Note ___________________________________________   First MD Initiated Contact with Patient 08/16/17 2103     (approximate)  I have reviewed the triage vital signs and the nursing notes.   HISTORY  Chief Complaint Chest Pain and Back Pain  HPI Theresa Wise is a 77 y.o. female with a history of a left bundle branch block as well as osteoporosis and osteoarthritis who is presenting to the emergency department with low lumbar pain that started this afternoon while she was riding on a lawnmower.  She says the pain only is when she is moving and when she is sitting upright and still the pain is absent.  She denies any radiation of the pain to the lower extremities.  Is not reporting any weakness or numbness.  Does not report any loss of bowel or bladder continence.  Denies any trauma.  Denies any chest pain or shortness of breath.  Past Medical History:  Diagnosis Date  . Clotting disorder ([place])   . Hypertension   . LBBB (left bundle branch block)     Patient Active Problem List   Diagnosis Date Noted  . Thrombocytopenia ([place]) 11/21/2015  . Arthritis 08/23/2015  . HLD (hyperlipidemia) 08/23/2015  . BP (high blood pressure) 08/23/2015  . 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 Abnormal gait 08/02/2015  . Leg weakness 08/02/2015  . H/O malignant neoplasm of skin 01/26/2015  . 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 History of cardiac catheterization 03/31/2014  . Personal history of other diseases of the circulatory system 03/31/2014  . H/O deep venous thrombosis 03/31/2014  . Beat, premature ventricular 03/31/2014  . Arthritis of knee, degenerative 10/13/2013  . History of knee surgery 10/13/2013  . Referred by specialist physician 07/15/2013  . Blood glucose elevated 12/24/2011    Past Surgical History:  Procedure Laterality Date  . ABDOMINAL HYSTERECTOMY    . EYE SURGERY    . MEDIAL PARTIAL  KNEE REPLACEMENT Right     Prior to Admission medications   Medication Sig Start Date End Date Taking? Authorizing Provider  amLODipine (NORVASC) 5 MG tablet TAKE 1 TABLET (5 MG TOTAL) BY MOUTH ONCE DAILY. 08/14/15   [provider]  Ascorbic Acid (VITAMIN C) 1000 MG tablet Take 1,000 mg by mouth daily.     [provider]  Cholecalciferol (VITAMIN D3) 2000 units capsule Take 2,000 Units by mouth daily.     [provider]  meclizine (ANTIVERT) 25 MG tablet Take 1 tablet (25 mg total) by mouth 3 (three) times daily as needed for dizziness. 07/10/16   Nance Pear, MD  metoprolol (LOPRESSOR) 100 MG tablet Take 100 mg by mouth 2 (two) times daily.  07/07/15   [provider]  ramipril (ALTACE) 10 MG capsule Take 20 mg by mouth 2 (two) times daily.  05/10/15   [provider]  simvastatin (ZOCOR) 10 MG tablet Take 10 mg by mouth daily at 6 PM.  07/05/15   [provider]    Allergies Codeine  Family History  Problem Relation Age of Onset  . Diabetes Father   . Breast cancer Neg Hx     Social History Social History   Tobacco Use  . Smoking status: Never Smoker  . Smokeless tobacco: Never Used  Substance Use Topics  . Alcohol use: No  . Drug use: Never    Review of Systems  Constitutional: No fever/chills Eyes: No visual changes. ENT: No sore throat. Cardiovascular: Denies chest  pain. Respiratory: Denies shortness of breath. Gastrointestinal: No abdominal pain.  No nausea, no vomiting.  No diarrhea.  No constipation. Genitourinary: Negative for dysuria. Musculoskeletal: Negative for back pain. Skin: Negative for rash. Neurological: Negative for headaches, focal weakness or numbness.   ____________________________________________   PHYSICAL EXAM:  VITAL SIGNS: ED Triage Vitals  Enc Vitals Group     BP 08/16/17 1848 140/62     Pulse Rate 08/16/17 1848 (!) 56     Resp 08/16/17 1848 16     Temp 08/16/17 1848 98.4 F  (36.9 C)     Temp Source 08/16/17 1848 Oral     SpO2 08/16/17 1848 96 %     Weight 08/16/17 1849 160 lb (72.6 kg)     Height 08/16/17 1849 5' (1.524 m)     Head Circumference --      Peak Flow --      Pain Score 08/16/17 1854 10     Pain Loc --      Pain Edu? --      Excl. in [place]? --     Constitutional: Alert and oriented. Well appearing and in no acute distress. Eyes: Conjunctivae are normal.  Head: Atraumatic. Nose: No congestion/rhinnorhea. Mouth/Throat: Mucous membranes are moist.  Neck: No stridor.   Cardiovascular: Normal rate, regular rhythm. Grossly normal heart sounds.   Respiratory: Normal respiratory effort.  No retractions. Lungs CTAB. Gastrointestinal: Soft and nontender. No distention. No CVA tenderness. Musculoskeletal: No lower extremity tenderness nor edema.  No joint effusions.  Tenderness to palpation to the midline thoracic nor lumbar spines.  No lateralizing tenderness.  No deformity or step-off. Neurologic:  Normal speech and language. No gross focal neurologic deficits are appreciated. Skin:  Skin is warm, dry and intact. No rash noted. Psychiatric: Mood and affect are normal. Speech and behavior are normal.  ____________________________________________   LABS (all labs ordered are listed, but only abnormal results are displayed)  Labs Reviewed  BASIC METABOLIC PANEL - Abnormal; Notable for the following components:      Result Value   Chloride 99 (*)    BUN 21 (*)    Creatinine, Ser 1.07 (*)    GFR calc non Af Amer 48 (*)    GFR calc Af Amer 56 (*)    All other components within normal limits  CBC - Abnormal; Notable for the following components:   Platelets 141 (*)    All other components within normal limits  TROPONIN I   ____________________________________________  EKG  ED ECG REPORT I, Doran Stabler, the attending physician, personally viewed and interpreted this ECG.   Date: 08/16/2017  EKG Time: 1846  Rate: 58  Rhythm: normal  sinus rhythm  Axis: Left axis  Intervals:left bundle branch block  ST&T Change: No ST segment elevation or depression.  T wave inversions in 1 and aVL.  ____________________________________________  RADIOLOGY  Chest x-ray without acute abnormality.  Films with multiple level degenerative disc disease and facet arthropathy throughout the lumbar spine.  Near complete fusion with small disc space at L1-L2 and to lesser extent of L4-L5.  No acute osseous abnormalities. ____________________________________________   PROCEDURES  Procedure(s) performed:   Procedures  Critical Care performed:   ____________________________________________   INITIAL IMPRESSION / ASSESSMENT AND PLAN / ED COURSE  Pertinent labs & imaging results that were available during my care of the patient were reviewed by me and considered in my medical decision making (see chart for details).  DDX: Compression fracture, muscle spasm,  low back pain, osteoarthritis As part of my medical decision making, I reviewed the following data within the [facility] Notes from prior ED visits  ----------------------------------------- 11:40 PM on 08/16/2017 -----------------------------------------  Patient offered pain medicine but is refusing at this time.  She says that she may take Tylenol at home.  I recommended that she is a muscle cream such as Aspercreme or icy hot and will follow up with the spinal surgeon/neurosurgeon.  No acute finding at this time.  No radiation of the pain.  No neurologic deficits.  Unlikely to be any spinal compression.  Reassuring lab work as well as EKG and chest x-ray.  Patient understanding the diagnosis and willing to comply. ____________________________________________   FINAL CLINICAL IMPRESSION(S) / ED DIAGNOSES  Osteoarthritis.  Low back pain.    NEW MEDICATIONS STARTED DURING THIS VISIT:  New Prescriptions   No medications on file     Note:  This document  was prepared using Dragon voice recognition software and may include unintentional dictation errors.     Orbie Pyo, MD 08/16/17 [phone]

## 2017-09-04 ENCOUNTER — Other Ambulatory Visit: Payer: Self-pay

## 2017-09-04 ENCOUNTER — Emergency Department
Admission: EM | Admit: 2017-09-04 | Discharge: 2017-09-04 | Disposition: A | Discharge status: Home / Self Care | Payer: Medicare Other | Attending: Emergency Medicine | Admitting: Emergency Medicine

## 2017-09-04 ENCOUNTER — Encounter: Payer: Self-pay | Admitting: Emergency Medicine

## 2017-09-04 DIAGNOSIS — Y9389 Activity, other specified: Secondary | ICD-10-CM | POA: Insufficient documentation

## 2017-09-04 DIAGNOSIS — I10 Essential (primary) hypertension: Secondary | ICD-10-CM | POA: Diagnosis not present

## 2017-09-04 DIAGNOSIS — Z79899 Other long term (current) drug therapy: Secondary | ICD-10-CM | POA: Diagnosis not present

## 2017-09-04 DIAGNOSIS — Y92013 Bedroom of single-family (private) house as the place of occurrence of the external cause: Secondary | ICD-10-CM | POA: Insufficient documentation

## 2017-09-04 DIAGNOSIS — Y999 Unspecified external cause status: Secondary | ICD-10-CM | POA: Insufficient documentation

## 2017-09-04 DIAGNOSIS — X58XXXA Exposure to other specified factors, initial encounter: Secondary | ICD-10-CM | POA: Insufficient documentation

## 2017-09-04 DIAGNOSIS — Z0389 Encounter for observation for other suspected diseases and conditions ruled out: Secondary | ICD-10-CM | POA: Diagnosis not present

## 2017-09-04 DIAGNOSIS — T161XXA Foreign body in right ear, initial encounter: Secondary | ICD-10-CM

## 2017-09-04 MED ORDER — LIDOCAINE VISCOUS HCL 2 % MT SOLN: 15.0000 mL | Freq: Once | OROMUCOSAL | Status: DC

## 2017-09-04 NOTE — ED Provider Notes (Signed)
[facility] Emergency Department Provider Note  ____________________________________________   First MD Initiated Contact with Patient 09/04/17 [id number]     (approximate)  I have reviewed the triage vital signs and the nursing notes.   HISTORY  Chief Complaint Foreign Body    HPI Theresa Wise is a 77 y.o. female who self presents to the emergency department after feeling an insect crawl into her right ear while she was in bed this evening.  She felt it initially crawl in along with sudden onset severe discomfort however the symptoms resolved shortly after a few minutes.  She is not sure if it still there.  She is currently in no pain.  She does not hear anything.  Past Medical History:  Diagnosis Date  . Clotting disorder ([place])   . Hypertension   . LBBB (left bundle branch block)     Patient Active Problem List   Diagnosis Date Noted  . Thrombocytopenia ([place]) 11/21/2015  . Arthritis 08/23/2015  . HLD (hyperlipidemia) 08/23/2015  . BP (high blood pressure) 08/23/2015  . 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 Abnormal gait 08/02/2015  . Leg weakness 08/02/2015  . H/O malignant neoplasm of skin 01/26/2015  . 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 History of cardiac catheterization 03/31/2014  . Personal history of other diseases of the circulatory system 03/31/2014  . H/O deep venous thrombosis 03/31/2014  . Beat, premature ventricular 03/31/2014  . Arthritis of knee, degenerative 10/13/2013  . History of knee surgery 10/13/2013  . Referred by specialist physician 07/15/2013  . Blood glucose elevated 12/24/2011    Past Surgical History:  Procedure Laterality Date  . ABDOMINAL HYSTERECTOMY    . EYE SURGERY    . MEDIAL PARTIAL KNEE REPLACEMENT Right     Prior to Admission medications   Medication Sig Start Date End Date Taking? Authorizing Provider  amLODipine (NORVASC) 5 MG tablet TAKE 1 TABLET (5 MG TOTAL) BY MOUTH ONCE  DAILY. 08/14/15   [provider]  Ascorbic Acid (VITAMIN C) 1000 MG tablet Take 1,000 mg by mouth daily.     [provider]  aspirin EC 81 MG tablet Take 81 mg by mouth daily.    [provider]  atorvastatin (LIPITOR) 40 MG tablet Take 40 mg by mouth daily.    [provider]  Cholecalciferol (VITAMIN D3) 2000 units capsule Take 2,000 Units by mouth daily.     [provider]  fluticasone (FLONASE) 50 MCG/ACT nasal spray Place 1 spray into both nostrils daily.    [provider]  hydrochlorothiazide (HYDRODIURIL) 25 MG tablet Take 25 mg by mouth daily.    [provider]  meclizine (ANTIVERT) 25 MG tablet Take 1 tablet (25 mg total) by mouth 3 (three) times daily as needed for dizziness. Patient not taking: Reported on 08/16/2017 07/10/16   Nance Pear, MD  metoprolol (LOPRESSOR) 100 MG tablet Take 100 mg by mouth 2 (two) times daily.  07/07/15   [provider]  Misc Natural Products (OSTEO BI-FLEX ADV JOINT SHIELD PO) Take by mouth.    [provider]  ramipril (ALTACE) 10 MG capsule Take 20 mg by mouth 2 (two) times daily.  05/10/15   [provider]  ranitidine (ZANTAC) 150 MG tablet Take 150 mg by mouth 2 (two) times daily.    [provider]    Allergies Codeine  Family History  Problem Relation Age of Onset  . Diabetes Father   . Breast cancer  Neg Hx     Social History Social History   Tobacco Use  . Smoking status: Never Smoker  . Smokeless tobacco: Never Used  Substance Use Topics  . Alcohol use: No  . Drug use: Never    Review of Systems Constitutional: No fever/chills ENT: Positive for ear pain Cardiovascular: Denies chest pain. Respiratory: Denies shortness of breath. Gastrointestinal: No abdominal pain.  No nausea, no vomiting.  Neurological: Negative for headaches   ____________________________________________   PHYSICAL EXAM:  VITAL SIGNS: ED Triage  Vitals  Enc Vitals Group     BP 09/04/17 0051 (!) 172/58     Pulse Rate 09/04/17 0051 (!) 54     Resp 09/04/17 0051 18     Temp 09/04/17 0051 98.1 F (36.7 C)     Temp Source 09/04/17 0051 Oral     SpO2 09/04/17 0051 97 %     Weight 09/04/17 0050 160 lb (72.6 kg)     Height 09/04/17 0050 5' (1.524 m)     Head Circumference --      Peak Flow --      Pain Score 09/04/17 0050 0     Pain Loc --      Pain Edu? --      Excl. in [place]? --     Constitutional: Alert and oriented x4 pleasant cooperative speaks in full clear sentences no diaphoresis Head: No foreign bodies identified in either ear canal.  Tympanic membranes intact.  No evidence of otitis externa. Nose: No congestion/rhinnorhea. Mouth/Throat: No trismus Neck: No stridor.   Cardiovascular: Regular rate and rhythm Respiratory: Normal respiratory effort.  No retractions. Neurologic:  Normal speech and language. No gross focal neurologic deficits are appreciated.  Skin:  Skin is warm, dry and intact. No rash noted.    ____________________________________________  LABS (all labs ordered are listed, but only abnormal results are displayed)  Labs Reviewed - No data to display   __________________________________________  EKG   ____________________________________________  RADIOLOGY   ____________________________________________   DIFFERENTIAL includes but not limited to  Foreign body in ear, tympanic membrane rupture, otitis media   PROCEDURES  Procedure(s) performed: no  Procedures  Critical Care performed: no  Observation: no ____________________________________________   INITIAL IMPRESSION / ASSESSMENT AND PLAN / ED COURSE  Pertinent labs & imaging results that were available during my care of the patient were reviewed by me and considered in my medical decision making (see chart for details).  The patient's symptoms are resolved and her exam is normal.  She likely had an insect crawling to her  ear and then crawl out.  She feels reassured.  Discharged home in stable condition.      ____________________________________________   FINAL CLINICAL IMPRESSION(S) / ED DIAGNOSES  Final diagnoses:  Ear foreign body, right, initial encounter      NEW MEDICATIONS STARTED DURING THIS VISIT:  Discharge Medication List as of 09/04/2017  3:03 AM       Note:  This document was prepared using Dragon voice recognition software and may include unintentional dictation errors.      Darel Hong, MD 09/04/17 2302

## 2017-09-04 NOTE — ED Triage Notes (Addendum)
Patient ambulatory to triage with steady gait, without difficulty or distress noted; pt reports something in right ear, feels movement at times; pt denies pain at this time

## 2018-01-16 ENCOUNTER — Ambulatory Visit: Payer: Medicare Other | Admitting: General Surgery

## 2018-01-21 ENCOUNTER — Encounter: Payer: Self-pay | Admitting: General Surgery

## 2018-01-21 ENCOUNTER — Ambulatory Visit (INDEPENDENT_AMBULATORY_CARE_PROVIDER_SITE_OTHER): Payer: Medicare Other | Admitting: General Surgery

## 2018-01-21 ENCOUNTER — Ambulatory Visit: Payer: Self-pay

## 2018-01-21 ENCOUNTER — Other Ambulatory Visit: Payer: Self-pay

## 2018-01-21 VITALS — BP 158/82 | HR 67 | Temp 97.5°F | Resp 16 | Ht 59.0 in | Wt 164.0 lb

## 2018-01-21 DIAGNOSIS — R222 Localized swelling, mass and lump, trunk: Secondary | ICD-10-CM

## 2018-01-21 NOTE — Progress Notes (Signed)
Patient ID: Theresa Wise, female   DOB: March 11, 1940, 77 y.o.   MRN: [id number]  Chief Complaint  Patient presents with  . Mass    right chest wall    HPI Theresa Wise is a 77 y.o. female.  who presents for evaluation of right chest knot referred by Dr Astrid Divine. Denies pain.She states it has been there for 1-2 years, same size. She gets regular mammograms, done but this is chest area.  She is here with her husband, Laverna Peace of 23 years.  HPI  Past Medical History:  Diagnosis Date  . Clotting disorder ([place])   . Hypertension   . LBBB (left bundle branch block)   . Pneumonia     Past Surgical History:  Procedure Laterality Date  . ABDOMINAL HYSTERECTOMY    . EYE SURGERY    . MEDIAL PARTIAL KNEE REPLACEMENT Right   . trach     in past    Family History  Problem Relation Age of Onset  . Diabetes Father   . Breast cancer Neg Hx   . Colon cancer Neg Hx     Social History Social History   Tobacco Use  . Smoking status: Never Smoker  . Smokeless tobacco: Never Used  Substance Use Topics  . Alcohol use: No  . Drug use: Never    Allergies  Allergen Reactions  . Codeine Hives    Current Outpatient Medications  Medication Sig Dispense Refill  . amLODipine (NORVASC) 5 MG tablet TAKE 1 TABLET (5 MG TOTAL) BY MOUTH ONCE DAILY.  6  . Ascorbic Acid (VITAMIN C) 1000 MG tablet Take 1,000 mg by mouth daily.     Marland Kitchen aspirin EC 81 MG tablet Take 81 mg by mouth daily.    Marland Kitchen atorvastatin (LIPITOR) 40 MG tablet Take 40 mg by mouth daily.    . Cholecalciferol (VITAMIN D3) 2000 units capsule Take 2,000 Units by mouth daily.     . fluticasone (FLONASE) 50 MCG/ACT nasal spray Place 1 spray into both nostrils as needed.     . hydrochlorothiazide (HYDRODIURIL) 25 MG tablet Take 25 mg by mouth daily.    . meclizine (ANTIVERT) 25 MG tablet Take 1 tablet (25 mg total) by mouth 3 (three) times daily as needed for dizziness. 20 tablet 0  . metoprolol (LOPRESSOR) 100 MG tablet Take 100 mg by  mouth 2 (two) times daily.     . Misc Natural Products (OSTEO BI-FLEX ADV JOINT SHIELD PO) Take by mouth.    . ramipril (ALTACE) 10 MG capsule Take 20 mg by mouth 2 (two) times daily.     . ranitidine (ZANTAC) 150 MG tablet Take 150 mg by mouth 2 (two) times daily.     No current facility-administered medications for this visit.     Review of Systems Review of Systems  Constitutional: Negative.   Respiratory: Negative.   Cardiovascular: Negative.     Blood pressure (!) 158/82, pulse 67, temperature (!) 97.5 F (36.4 C), temperature source Skin, resp. rate 16, height 4\' 11"  (1.499 m), weight 164 lb (74.4 kg), SpO2 95 %.  Physical Exam Physical Exam  Constitutional: She is oriented to person, place, and time. She appears well-developed and well-nourished.  HENT:  Mouth/Throat: Oropharynx is clear and moist.  Eyes: Conjunctivae are normal.  Neck: Neck supple.  Pulmonary/Chest:  Thickening right chest wall    Lymphadenopathy:    She has no axillary adenopathy.       Right: No supraclavicular adenopathy present.  Neurological: She is alert and oriented to person, place, and time.  Skin: Skin is warm and dry.  Psychiatric: Her behavior is normal.    Data Reviewed April 22, 2017 bilateral diagnostic mammograms and right breast ultrasound were reviewed.  No mammographic or ultrasound abnormality to correlate with the patient's reported mass in the upper aspect of the right breast. BI-RADS-1.  Repeat ultrasound examination of the area was completed.  No cystic or solid lesions, evidence of architectural distortion or mass-effect was identified.  BI-RADS-1.  No images.  PCP notes of December 18, 2017 reviewed.  A 1 cm subcutaneous mass under the clavicle is reported. Assessment    No clearly demonstrable abnormality of the right upper chest wall.    Plan    The patient has been encouraged to examine this area once a month during her regular self examinations, but otherwise  refrain from manipulating the area.  If she appreciates a true increase in size or develops pain or appreciates tenderness without direct pressure, she was encouraged to call for reassessment. Follow up as needed or if symptoms worsen or change. The patient is aware to call back for any questions or new concerns.      HPI, Physical Exam, Assessment and Plan have been scribed under the direction and in the presence of Robert Bellow, MD. Karie Fetch, RN   Forest Gleason Serenity Batley 01/22/2018, 5:49 PM

## 2018-01-21 NOTE — Patient Instructions (Addendum)
The patient is aware to call back for any questions or new concerns.  Follow up as needed or if symptoms worsen or change. 

## 2018-01-22 DIAGNOSIS — R222 Localized swelling, mass and lump, trunk: Secondary | ICD-10-CM | POA: Insufficient documentation

## 2019-08-10 ENCOUNTER — Other Ambulatory Visit: Payer: Self-pay | Admitting: Family Medicine

## 2019-08-10 ENCOUNTER — Other Ambulatory Visit: Payer: Self-pay

## 2019-08-10 ENCOUNTER — Ambulatory Visit
Admission: RE | Admit: 2019-08-10 | Discharge: 2019-08-10 | Disposition: A | Discharge status: Home / Self Care | Payer: Medicare Other | Source: Ambulatory Visit | Attending: Family Medicine | Admitting: Family Medicine

## 2019-08-10 DIAGNOSIS — R600 Localized edema: Secondary | ICD-10-CM

## 2021-04-25 ENCOUNTER — Ambulatory Visit (INDEPENDENT_AMBULATORY_CARE_PROVIDER_SITE_OTHER): Payer: Medicare Other | Admitting: Nurse Practitioner

## 2021-04-25 ENCOUNTER — Other Ambulatory Visit: Payer: Self-pay

## 2021-04-25 VITALS — BP 133/62 | HR 52 | Ht <= 58 in | Wt 140.0 lb

## 2021-04-25 DIAGNOSIS — M79604 Pain in right leg: Secondary | ICD-10-CM | POA: Diagnosis not present

## 2021-04-25 DIAGNOSIS — M79605 Pain in left leg: Secondary | ICD-10-CM

## 2021-04-25 DIAGNOSIS — G608 Other hereditary and idiopathic neuropathies: Secondary | ICD-10-CM

## 2021-04-25 DIAGNOSIS — I420 Dilated cardiomyopathy: Secondary | ICD-10-CM

## 2021-04-29 ENCOUNTER — Encounter (INDEPENDENT_AMBULATORY_CARE_PROVIDER_SITE_OTHER): Payer: Self-pay | Admitting: Nurse Practitioner
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
## 2021-05-31 ENCOUNTER — Other Ambulatory Visit (INDEPENDENT_AMBULATORY_CARE_PROVIDER_SITE_OTHER): Payer: Self-pay | Admitting: Nurse Practitioner

## 2021-05-31 DIAGNOSIS — M79604 Pain in right leg: Secondary | ICD-10-CM

## 2021-06-01 ENCOUNTER — Ambulatory Visit (INDEPENDENT_AMBULATORY_CARE_PROVIDER_SITE_OTHER): Payer: Medicare Other | Admitting: Nurse Practitioner

## 2021-06-01 ENCOUNTER — Encounter (INDEPENDENT_AMBULATORY_CARE_PROVIDER_SITE_OTHER): Payer: Self-pay | Admitting: Nurse Practitioner

## 2021-06-01 ENCOUNTER — Ambulatory Visit (INDEPENDENT_AMBULATORY_CARE_PROVIDER_SITE_OTHER): Payer: Medicare Other

## 2021-06-01 VITALS — BP 148/73 | HR 71 | Resp 17 | Ht 60.0 in | Wt 150.0 lb

## 2021-06-01 DIAGNOSIS — M79605 Pain in left leg: Secondary | ICD-10-CM | POA: Diagnosis not present

## 2021-06-01 DIAGNOSIS — G608 Other hereditary and idiopathic neuropathies: Secondary | ICD-10-CM | POA: Diagnosis not present

## 2021-06-01 DIAGNOSIS — E785 Hyperlipidemia, unspecified: Secondary | ICD-10-CM | POA: Diagnosis not present

## 2021-06-01 DIAGNOSIS — M79604 Pain in right leg: Secondary | ICD-10-CM | POA: Diagnosis not present

## 2021-06-17 ENCOUNTER — Encounter (INDEPENDENT_AMBULATORY_CARE_PROVIDER_SITE_OTHER): Payer: Self-pay | Admitting: Nurse Practitioner
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
## 2021-06-18 ENCOUNTER — Encounter: Payer: Self-pay | Admitting: Emergency Medicine

## 2021-06-18 ENCOUNTER — Other Ambulatory Visit: Payer: Self-pay

## 2021-06-18 ENCOUNTER — Emergency Department
Admission: EM | Admit: 2021-06-18 | Discharge: 2021-06-18 | Disposition: A | Discharge status: Home / Self Care | Payer: Medicare Other | Attending: Student in an Organized Health Care Education/Training Program | Admitting: Student in an Organized Health Care Education/Training Program

## 2021-06-18 ENCOUNTER — Emergency Department: Payer: Medicare Other

## 2021-06-18 DIAGNOSIS — I10 Essential (primary) hypertension: Secondary | ICD-10-CM | POA: Diagnosis not present

## 2021-06-18 DIAGNOSIS — R002 Palpitations: Secondary | ICD-10-CM

## 2021-06-18 DIAGNOSIS — R000 Tachycardia, unspecified: Secondary | ICD-10-CM | POA: Diagnosis present

## 2021-06-18 DIAGNOSIS — E876 Hypokalemia: Secondary | ICD-10-CM

## 2021-06-18 LAB — MAGNESIUM: Magnesium: 1.7 mg/dL (ref 1.7–2.4)

## 2021-06-18 LAB — CBC
HCT: 37 % (ref 36.0–46.0)
Hemoglobin: 12.2 g/dL (ref 12.0–15.0)
MCH: 29 pg (ref 26.0–34.0)
MCHC: 33 g/dL (ref 30.0–36.0)
MCV: 87.9 fL (ref 80.0–100.0)
Platelets: 135 10*3/uL — ABNORMAL LOW (ref 150–400)
RBC: 4.21 MIL/uL (ref 3.87–5.11)
RDW: 13.5 % (ref 11.5–15.5)
WBC: 4.3 10*3/uL (ref 4.0–10.5)
nRBC: 0 % (ref 0.0–0.2)

## 2021-06-18 LAB — COMPREHENSIVE METABOLIC PANEL
ALT: 13 U/L (ref 0–44)
AST: 22 U/L (ref 15–41)
Albumin: 3.9 g/dL (ref 3.5–5.0)
Alkaline Phosphatase: 79 U/L (ref 38–126)
Anion gap: 7 (ref 5–15)
BUN: 14 mg/dL (ref 8–23)
CO2: 29 mmol/L (ref 22–32)
Calcium: 9.2 mg/dL (ref 8.9–10.3)
Chloride: 106 mmol/L (ref 98–111)
Creatinine, Ser: 0.97 mg/dL (ref 0.44–1.00)
GFR, Estimated: 58 mL/min — ABNORMAL LOW (ref >60)
Glucose, Bld: 119 mg/dL — ABNORMAL HIGH (ref 70–99)
Potassium: 3.1 mmol/L — ABNORMAL LOW (ref 3.5–5.1)
Sodium: 142 mmol/L (ref 135–145)
Total Bilirubin: 1.1 mg/dL (ref 0.3–1.2)
Total Protein: 7.1 g/dL (ref 6.5–8.1)

## 2021-06-18 LAB — TROPONIN I (HIGH SENSITIVITY)
Troponin I (High Sensitivity): 16 ng/L (ref <18)
Troponin I (High Sensitivity): 15 ng/L (ref <18)

## 2021-06-18 MED ORDER — MAGNESIUM SULFATE IN D5W 1-5 GM/100ML-% IV SOLN
1.0000 g | Freq: Once | INTRAVENOUS | Status: AC
Start: 2021-06-18 — End: 2021-06-18
  Administered 2021-06-18: 1 g via INTRAVENOUS
  Filled 2021-06-18: qty 100

## 2021-06-18 MED ORDER — MAGNESIUM SULFATE 2 GM/50ML IV SOLN: 2.0000 g | Freq: Once | INTRAVENOUS | Status: DC

## 2021-06-18 MED ORDER — POTASSIUM CHLORIDE CRYS ER 20 MEQ PO TBCR: 20.0000 meq | EXTENDED_RELEASE_TABLET | Freq: Every day | ORAL | 0 refills | Status: AC

## 2021-06-18 MED ORDER — POTASSIUM CHLORIDE CRYS ER 20 MEQ PO TBCR
40.0000 meq | EXTENDED_RELEASE_TABLET | Freq: Once | ORAL | Status: AC
  Administered 2021-06-18: 40 meq via ORAL
  Filled 2021-06-18: qty 2

## 2021-06-18 NOTE — ED Notes (Signed)
Blue, green and purple drawn and sent to lab ?

## 2021-06-18 NOTE — ED Notes (Signed)
Lab called and advised one letter from patient name and one number from MRN is missing so they cannot accept the labs and they should be recollected, Causing a delay in labs.  ?

## 2021-06-18 NOTE — ED Provider Notes (Signed)
? ?[facility] ?Provider Note ? ? ? Event Date/Time  ? First MD Initiated Contact with Patient 06/18/21 1019   ?  (approximate) ? ? ?History  ? ?Tachycardia ? ? ?HPI ? ?Theresa Wise is a 81 y.o. female   with a history of PVCs hyperlipidemia hypertension presents to the ER for evaluation of palpitations last night and jitteriness.  States it lasted quite some time.  Denied any chest pain does not feel short of breath.  No pain or discomfort with deep inspiration no lower extremity swelling.  No medication changes no fevers.  States that she feels still like she is having some palpitations so she came here after she felt a few episodes while she was preparing for church.  She does states that she does not feel anxious. ? ?  ? ? ?Physical Exam  ? ?Triage Vital Signs: ?ED Triage Vitals  ?Enc Vitals Group  ?   BP 06/18/21 1012 (!) 146/62  ?   Pulse Rate 06/18/21 1013 88  ?   Resp 06/18/21 1012 16  ?   Temp 06/18/21 1012 98.5 ?F (36.9 ?C)  ?   Temp Source 06/18/21 1012 Oral  ?   SpO2 06/18/21 1013 97 %  ?   Weight 06/18/21 1008 150 lb (68 kg)  ?   Height 06/18/21 1008 '4\' 11"'$  (1.499 m)  ?   Head Circumference --   ?   Peak Flow --   ?   Pain Score 06/18/21 1007 5  ?   Pain Loc --   ?   Pain Edu? --   ?   Excl. in [place]? --   ? ? ?Most recent vital signs: ?Vitals:  ? 06/18/21 1400 06/18/21 1416  ?BP: (!) 129/58   ?Pulse: 80   ?Resp: 14   ?Temp:  98.6 ?F (37 ?C)  ?SpO2: 97%   ? ? ? ?Constitutional: Alert  ?Eyes: Conjunctivae are normal.  ?Head: Atraumatic. ?Nose: No congestion/rhinnorhea. ?Mouth/Throat: Mucous membranes are moist.   ?Neck: Painless ROM.  ?Cardiovascular:   Good peripheral circulation.  Faint systolic murmur ?Respiratory: Normal respiratory effort.  No retractions. No crackles or wheeze ?Gastrointestinal: Soft and nontender.  ?Musculoskeletal:  no deformity ?Neurologic:  MAE spontaneously. No gross focal neurologic deficits are appreciated.  ?Skin:  Skin is warm, dry and intact. No rash  noted. ?Psychiatric: Mood and affect are normal. Speech and behavior are normal. ? ? ? ?ED Results / Procedures / Treatments  ? ?Labs ?(all labs ordered are listed, but only abnormal results are displayed) ?Labs Reviewed  ?CBC - Abnormal; Notable for the following components:  ?    Result Value  ? Platelets 135 (*)   ? All other components within normal limits  ?COMPREHENSIVE METABOLIC PANEL - Abnormal; Notable for the following components:  ? Potassium 3.1 (*)   ? Glucose, Bld 119 (*)   ? GFR, Estimated 58 (*)   ? All other components within normal limits  ?MAGNESIUM  ?TROPONIN I (HIGH SENSITIVITY)  ?TROPONIN I (HIGH SENSITIVITY)  ? ? ? ?EKG ? ?ED ECG REPORT ?I, Merlyn Lot, the attending physician, personally viewed and interpreted this ECG. ? ? Date: 06/18/2021 ? EKG Time: 10:10 ? Rate: 80 ? Rhythm: sinus ? Axis: left ? Intervals:lbbb ? ST&T Change: nonspecific st abn, no stemi ? ? ? ?RADIOLOGY ?Please see ED Course for my review and interpretation. ? ?I personally reviewed all radiographic images ordered to evaluate for the above acute complaints and  reviewed radiology reports and findings.  These findings were personally discussed with the patient.  Please see medical record for radiology report. ? ? ? ?PROCEDURES: ? ?Critical Care performed: No ? ?Procedures ? ? ?MEDICATIONS ORDERED IN ED: ?Medications  ?potassium chloride SA (KLOR-CON M) CR tablet 40 mEq (40 mEq Oral Given 06/18/21 1133)  ?magnesium sulfate IVPB 1 g 100 mL (0 g Intravenous Stopped 06/18/21 1345)  ? ? ? ?IMPRESSION / MDM / ASSESSMENT AND PLAN / ED COURSE  ?I reviewed the triage vital signs and the nursing notes. ?             ?               ? ?Differential diagnosis includes, but is not limited to, dysrhythmia, electrolyte abnormality, anemia, ACS, CHF ? ?Patient presenting to the ER for symptoms as described above clinically very well-appearing in no acute distress.  Buttock be ordered for the but differential will order chest x-ray will  place on cardiac monitor.  Have lower suspicion for ACS does not seem consistent with dissection or PE.  Does not appear septic. ? ? ?Clinical Course as of 06/18/21 1520  ?Sun Jun 18, 2021  ?1039 Chest x-ray by my review interpretation does not show evidence of pneumothorax. [PR]  ?1215 Magnesium is low normal will give IV magnesium as he is hypokalemic. [PR]  ?1407 Repeat troponin negative.  Patient mains well-appearing in no acute distress.  Does appear stable and appropriate for outpatient follow-up. [PR]  ?  ?Clinical Course User Index ?[PR] Merlyn Lot, MD  ? ? ? ?FINAL CLINICAL IMPRESSION(S) / ED DIAGNOSES  ? ?Final diagnoses:  ?Palpitations  ?Hypokalemia  ? ? ? ?Rx / DC Orders  ? ?ED Discharge Orders   ? ?      Ordered  ?  potassium chloride SA (KLOR-CON M) 20 MEQ tablet  Daily       ? 06/18/21 1408  ? ?  ?  ? ?  ? ? ? ?Note:  This document was prepared using Dragon voice recognition software and may include unintentional dictation errors. ? ?  ?Merlyn Lot, MD ?06/18/21 1520 ? ?

## 2021-06-18 NOTE — ED Triage Notes (Signed)
Pt via POV from home. Pt c/o tachycardia, states she feels like her heart was racing, states that she took her pulse and it was 113. Pt states states she is SOB and feels jittery and it has been going on since last night. Denies any hx of irregular heart beat. Pt states she has a dull pain in her chest. Pt is A&OX4 and NAD.  ?

## 2021-09-22 ENCOUNTER — Other Ambulatory Visit: Payer: Self-pay | Admitting: Family Medicine

## 2021-09-22 DIAGNOSIS — R7989 Other specified abnormal findings of blood chemistry: Secondary | ICD-10-CM

## 2021-09-22 DIAGNOSIS — N1832 Chronic kidney disease, stage 3b: Secondary | ICD-10-CM

## 2021-10-04 ENCOUNTER — Ambulatory Visit
Admission: RE | Admit: 2021-10-04 | Discharge: 2021-10-04 | Disposition: A | Discharge status: Home / Self Care | Payer: Medicare Other | Source: Ambulatory Visit | Attending: Family Medicine | Admitting: Family Medicine

## 2021-10-04 DIAGNOSIS — R7989 Other specified abnormal findings of blood chemistry: Secondary | ICD-10-CM | POA: Diagnosis present

## 2021-10-04 DIAGNOSIS — N1832 Chronic kidney disease, stage 3b: Secondary | ICD-10-CM | POA: Diagnosis present

## 2022-09-23 ENCOUNTER — Emergency Department
Admission: EM | Admit: 2022-09-23 | Discharge: 2022-09-23 | Disposition: A | Discharge status: Home / Self Care | Payer: Medicare Other | Source: Home / Self Care | Attending: Emergency Medicine | Admitting: Emergency Medicine

## 2022-09-23 ENCOUNTER — Emergency Department: Payer: Medicare Other

## 2022-09-23 ENCOUNTER — Encounter: Payer: Self-pay | Admitting: Emergency Medicine

## 2022-09-23 ENCOUNTER — Other Ambulatory Visit: Payer: Self-pay

## 2022-09-23 DIAGNOSIS — R001 Bradycardia, unspecified: Secondary | ICD-10-CM | POA: Insufficient documentation

## 2022-09-23 DIAGNOSIS — R42 Dizziness and giddiness: Secondary | ICD-10-CM | POA: Diagnosis not present

## 2022-09-23 DIAGNOSIS — H538 Other visual disturbances: Secondary | ICD-10-CM | POA: Insufficient documentation

## 2022-09-23 LAB — URINE DRUG SCREEN, QUALITATIVE (ARMC ONLY)
Amphetamines, Ur Screen: NOT DETECTED
Barbiturates, Ur Screen: NOT DETECTED
Benzodiazepine, Ur Scrn: NOT DETECTED
Cannabinoid 50 Ng, Ur ~~LOC~~: NOT DETECTED
Cocaine Metabolite,Ur ~~LOC~~: NOT DETECTED
MDMA (Ecstasy)Ur Screen: NOT DETECTED
Methadone Scn, Ur: NOT DETECTED
Opiate, Ur Screen: NOT DETECTED
Phencyclidine (PCP) Ur S: NOT DETECTED
Tricyclic, Ur Screen: NOT DETECTED

## 2022-09-23 LAB — CBC
HCT: 35.9 % — ABNORMAL LOW (ref 36.0–46.0)
Hemoglobin: 12.2 g/dL (ref 12.0–15.0)
MCH: 30 pg (ref 26.0–34.0)
MCHC: 34 g/dL (ref 30.0–36.0)
MCV: 88.2 fL (ref 80.0–100.0)
Platelets: 152 10*3/uL (ref 150–400)
RBC: 4.07 MIL/uL (ref 3.87–5.11)
RDW: 13 % (ref 11.5–15.5)
WBC: 6.2 10*3/uL (ref 4.0–10.5)
nRBC: 0 % (ref 0.0–0.2)

## 2022-09-23 LAB — URINALYSIS, ROUTINE W REFLEX MICROSCOPIC
Bacteria, UA: NONE SEEN
Bilirubin Urine: NEGATIVE
Glucose, UA: NEGATIVE mg/dL
Hgb urine dipstick: NEGATIVE
Ketones, ur: NEGATIVE mg/dL
Nitrite: NEGATIVE
Protein, ur: 30 mg/dL — AB
Specific Gravity, Urine: 1.019 (ref 1.005–1.030)
WBC, UA: >50 WBC/hpf (ref 0–5)
pH: 5 (ref 5.0–8.0)

## 2022-09-23 LAB — BASIC METABOLIC PANEL
Anion gap: 7 (ref 5–15)
BUN: 15 mg/dL (ref 8–23)
CO2: 28 mmol/L (ref 22–32)
Calcium: 9.1 mg/dL (ref 8.9–10.3)
Chloride: 108 mmol/L (ref 98–111)
Creatinine, Ser: 0.95 mg/dL (ref 0.44–1.00)
GFR, Estimated: 59 mL/min — ABNORMAL LOW (ref >60)
Glucose, Bld: 119 mg/dL — ABNORMAL HIGH (ref 70–99)
Potassium: 4.4 mmol/L (ref 3.5–5.1)
Sodium: 143 mmol/L (ref 135–145)

## 2022-09-23 LAB — CBG MONITORING, ED: Glucose-Capillary: 101 mg/dL — ABNORMAL HIGH (ref 70–99)

## 2022-09-23 MED ORDER — MECLIZINE HCL 25 MG PO TABS
25.0000 mg | ORAL_TABLET | Freq: Once | ORAL | Status: AC
  Administered 2022-09-23: 25 mg via ORAL
  Filled 2022-09-23: qty 1

## 2022-09-23 MED ORDER — MECLIZINE HCL 25 MG PO TABS: 25.0000 mg | ORAL_TABLET | Freq: Three times a day (TID) | ORAL | 0 refills | Status: AC | PRN

## 2022-09-23 NOTE — ED Triage Notes (Signed)
Pt via POV c/o dizziness since last night when she was getting into bed. Pt has vomited once today with dizziness also and odd pressure across her forehead. Pt has blurry vision. Hx TIA. Ambulatory with cane, a/o x 4. Pt has slight weakness noted to left arm and leg.

## 2022-09-23 NOTE — ED Provider Notes (Signed)
[facility] Provider Note    Event Date/Time   First MD Initiated Contact with Patient 09/23/22 1536     (approximate)   History   Dizziness   HPI  Theresa Wise is a 82 y.o. female who presents to the emergency department today because of concerns for dizziness.  The patient's symptoms started last night as she was going to bed.  She notes that as she went to lay her head down.  She eventually was able to go to sleep.  When she woke up this morning she initially felt better but when she sat up the dizziness came back.  She did have a slight headache yesterday before going to bed.  Additionally feels like she is having some blurry vision.  Denies any hearing change.  Patient denies any weakness in her arms or legs.  Denies any similar symptoms in the past.     Physical Exam   Triage Vital Signs: ED Triage Vitals  Encounter Vitals Group     BP 09/23/22 1211 (!) 149/66     Systolic BP Percentile --      Diastolic BP Percentile --      Pulse Rate 09/23/22 1211 (!) 59     Resp 09/23/22 1211 16     Temp 09/23/22 1211 98.4 F (36.9 C)     Temp Source 09/23/22 1211 Oral     SpO2 09/23/22 1211 98 %     Weight 09/23/22 1213 140 lb (63.5 kg)     Height 09/23/22 1213 5' (1.524 m)     Head Circumference --      Peak Flow --      Pain Score 09/23/22 1213 0     Pain Loc --      Pain Education --      Exclude from Growth Chart --     Most recent vital signs: Vitals:   09/23/22 1211  BP: (!) 149/66  Pulse: (!) 59  Resp: 16  Temp: 98.4 F (36.9 C)  SpO2: 98%   General: Awake, alert, oriented. CV:  Good peripheral perfusion. Regular rate and rhythm. Resp:  Normal effort. Lungs clear. Abd:  No distention.  Other:  Face symmetric. PERRL. EOMI. Strength 5/5 in upper and lower extremities. Sensation intact.    ED Results / Procedures / Treatments   Labs (all labs ordered are listed, but only abnormal results are displayed) Labs Reviewed  BASIC  METABOLIC PANEL - Abnormal; Notable for the following components:      Result Value   Glucose, Bld 119 (*)    GFR, Estimated 59 (*)    All other components within normal limits  CBC - Abnormal; Notable for the following components:   HCT 35.9 (*)    All other components within normal limits  URINALYSIS, ROUTINE W REFLEX MICROSCOPIC - Abnormal; Notable for the following components:   Color, Urine YELLOW (*)    APPearance CLOUDY (*)    Protein, ur 30 (*)    Leukocytes,Ua LARGE (*)    All other components within normal limits  CBG MONITORING, ED - Abnormal; Notable for the following components:   Glucose-Capillary 101 (*)    All other components within normal limits  URINE DRUG SCREEN, QUALITATIVE ([facility] ONLY)  ETHANOL  PROTIME-INR  APTT  DIFFERENTIAL     EKG  I, Phineas Semen, attending physician, personally viewed and interpreted this EKG  EKG Time: 1214 Rate: 58 Rhythm: sinus bradycardia Axis: left axis deviation Intervals:  qtc 455 QRS: IVCD ST changes: no st elevation Impression: abnormal ekg   RADIOLOGY I independently interpreted and visualized the CT head. My interpretation: No bleed Radiology interpretation:  IMPRESSION:  1. No acute intracranial abnormalities.  2. Chronic microvascular disease.      PROCEDURES:  Critical Care performed: No    MEDICATIONS ORDERED IN ED: Medications - No data to display   IMPRESSION / MDM / ASSESSMENT AND PLAN / ED COURSE  I reviewed the triage vital signs and the nursing notes.                              Differential diagnosis includes, but is not limited to, vertigo, CVA  Patient's presentation is most consistent with acute presentation with potential threat to life or bodily function.   The patient is on the cardiac monitor to evaluate for evidence of arrhythmia and/or significant heart rate changes.  Patient presented to the emergency department today because of concerns for dizziness.  On exam no  focal neurodeficits.  CT scan was negative.  However will obtain MRI to evaluate for possible cerebral stroke.  Will give the patient meclizine while we await imaging to see if that does help her symptoms.  MRI brain without acute abnormality. At this time I think likely inner ear issue. Discussed with patient. She felt some improvement with antivert. Will discharge to follow up with ENT.      FINAL CLINICAL IMPRESSION(S) / ED DIAGNOSES   Final diagnoses:  Vertigo     Note:  This document was prepared using Dragon voice recognition software and may include unintentional dictation errors.    Phineas Semen, MD 09/23/22 [phone]

## 2022-09-23 NOTE — Discharge Instructions (Signed)
Please seek medical attention for any high fevers, chest pain, shortness of breath, change in behavior, persistent vomiting, bloody stool or any other new or concerning symptoms.  

## 2022-09-23 NOTE — ED Notes (Signed)
Notified EDP of pt presentation. Awaiting orders.
